# Patient Record
Sex: Female | Born: 1960 | Race: White | Hispanic: No | Smoking: Current every day smoker
Health system: Southern US, Community
[De-identification: ages and names within clinical notes are randomized; demographics above are authoritative.]

## PROBLEM LIST (undated history)

## (undated) DIAGNOSIS — J45909 Unspecified asthma, uncomplicated: Secondary | ICD-10-CM

## (undated) DIAGNOSIS — J449 Chronic obstructive pulmonary disease, unspecified: Secondary | ICD-10-CM

## (undated) DIAGNOSIS — I10 Essential (primary) hypertension: Secondary | ICD-10-CM

## (undated) DIAGNOSIS — G8929 Other chronic pain: Secondary | ICD-10-CM

## (undated) DIAGNOSIS — M2390 Unspecified internal derangement of unspecified knee: Secondary | ICD-10-CM

## (undated) DIAGNOSIS — M5136 Other intervertebral disc degeneration, lumbar region: Secondary | ICD-10-CM

## (undated) DIAGNOSIS — D496 Neoplasm of unspecified behavior of brain: Secondary | ICD-10-CM

## (undated) DIAGNOSIS — C801 Malignant (primary) neoplasm, unspecified: Secondary | ICD-10-CM

## (undated) DIAGNOSIS — M797 Fibromyalgia: Secondary | ICD-10-CM

## (undated) DIAGNOSIS — M169 Osteoarthritis of hip, unspecified: Secondary | ICD-10-CM

## (undated) DIAGNOSIS — M7070 Other bursitis of hip, unspecified hip: Secondary | ICD-10-CM

## (undated) DIAGNOSIS — M47816 Spondylosis without myelopathy or radiculopathy, lumbar region: Secondary | ICD-10-CM

## (undated) DIAGNOSIS — E785 Hyperlipidemia, unspecified: Secondary | ICD-10-CM

## (undated) DIAGNOSIS — I509 Heart failure, unspecified: Secondary | ICD-10-CM

## (undated) DIAGNOSIS — M47817 Spondylosis without myelopathy or radiculopathy, lumbosacral region: Secondary | ICD-10-CM

## (undated) DIAGNOSIS — M1710 Unilateral primary osteoarthritis, unspecified knee: Secondary | ICD-10-CM

## (undated) DIAGNOSIS — G20 Parkinson's disease: Secondary | ICD-10-CM

## (undated) DIAGNOSIS — M719 Bursopathy, unspecified: Secondary | ICD-10-CM

## (undated) DIAGNOSIS — G20A1 Parkinson's disease without dyskinesia, without mention of fluctuations: Secondary | ICD-10-CM

## (undated) DIAGNOSIS — M545 Low back pain: Secondary | ICD-10-CM

## (undated) DIAGNOSIS — M549 Dorsalgia, unspecified: Secondary | ICD-10-CM

## (undated) DIAGNOSIS — I4891 Unspecified atrial fibrillation: Secondary | ICD-10-CM

## (undated) DIAGNOSIS — G4730 Sleep apnea, unspecified: Secondary | ICD-10-CM

## (undated) DIAGNOSIS — K3532 Acute appendicitis with perforation and localized peritonitis, without abscess: Secondary | ICD-10-CM

## (undated) HISTORY — DX: Unspecified internal derangement of unspecified knee: M23.90

## (undated) HISTORY — DX: Essential (primary) hypertension: I10

## (undated) HISTORY — DX: Neoplasm of unspecified behavior of brain: D49.6

## (undated) HISTORY — DX: Fibromyalgia: M79.7

## (undated) HISTORY — DX: Osteoarthritis of hip, unspecified: M16.9

## (undated) HISTORY — DX: Bursopathy, unspecified: M71.9

## (undated) HISTORY — DX: Other intervertebral disc degeneration, lumbar region: M51.36

## (undated) HISTORY — DX: Parkinson's disease without dyskinesia, without mention of fluctuations: G20.A1

## (undated) HISTORY — DX: Unspecified atrial fibrillation: I48.91

## (undated) HISTORY — PX: CARPAL TUNNEL RELEASE: SHX101

## (undated) HISTORY — DX: Sleep apnea, unspecified: G47.30

## (undated) HISTORY — DX: Heart failure, unspecified: I50.9

## (undated) HISTORY — DX: Unspecified asthma, uncomplicated: J45.909

## (undated) HISTORY — DX: Low back pain: M54.5

## (undated) HISTORY — DX: Dorsalgia, unspecified: M54.9

## (undated) HISTORY — DX: Other bursitis of hip, unspecified hip: M70.70

## (undated) HISTORY — DX: Acute appendicitis with perforation, localized peritonitis, and gangrene, without abscess: K35.32

## (undated) HISTORY — DX: Chronic obstructive pulmonary disease, unspecified: J44.9

## (undated) HISTORY — DX: Parkinson's disease: G20

## (undated) HISTORY — DX: Other chronic pain: G89.29

## (undated) HISTORY — PX: APPENDECTOMY: SHX54

## (undated) HISTORY — PX: FINGER SURGERY: SHX640

## (undated) HISTORY — DX: Unilateral primary osteoarthritis, unspecified knee: M17.10

## (undated) HISTORY — DX: Malignant (primary) neoplasm, unspecified: C80.1

## (undated) HISTORY — DX: Spondylosis without myelopathy or radiculopathy, lumbosacral region: M47.817

## (undated) HISTORY — DX: Spondylosis without myelopathy or radiculopathy, lumbar region: M47.816

## (undated) HISTORY — DX: Hyperlipidemia, unspecified: E78.5

---

## 2003-08-15 ENCOUNTER — Other Ambulatory Visit: Payer: Self-pay

## 2005-02-10 ENCOUNTER — Ambulatory Visit: Payer: Self-pay

## 2005-11-02 ENCOUNTER — Ambulatory Visit: Payer: Self-pay | Admitting: Nurse Practitioner

## 2007-08-08 ENCOUNTER — Ambulatory Visit: Payer: Self-pay | Admitting: Family Medicine

## 2009-10-08 ENCOUNTER — Ambulatory Visit: Payer: Self-pay | Admitting: Family Medicine

## 2011-03-03 ENCOUNTER — Ambulatory Visit: Payer: Self-pay | Admitting: Family Medicine

## 2012-04-21 ENCOUNTER — Inpatient Hospital Stay: Payer: Self-pay | Admitting: Surgery

## 2012-04-21 LAB — URINALYSIS, COMPLETE
Bilirubin,UR: NEGATIVE
Hyaline Cast: 3
Leukocyte Esterase: NEGATIVE
Nitrite: NEGATIVE
Protein: 100
Squamous Epithelial: 5
WBC UR: 6 /HPF (ref 0–5)

## 2012-04-21 LAB — COMPREHENSIVE METABOLIC PANEL
Anion Gap: 10 (ref 7–16)
BUN: 14 mg/dL (ref 7–18)
Bilirubin,Total: 1.1 mg/dL — ABNORMAL HIGH (ref 0.2–1.0)
Calcium, Total: 8.5 mg/dL (ref 8.5–10.1)
Co2: 23 mmol/L (ref 21–32)
EGFR (Non-African Amer.): >60
Glucose: 123 mg/dL — ABNORMAL HIGH (ref 65–99)
SGPT (ALT): 16 U/L (ref 12–78)
Sodium: 133 mmol/L — ABNORMAL LOW (ref 136–145)

## 2012-04-21 LAB — CBC WITH DIFFERENTIAL/PLATELET
HCT: 43.2 % (ref 35.0–47.0)
MCH: 28.7 pg (ref 26.0–34.0)
MCHC: 34.1 g/dL (ref 32.0–36.0)
MCV: 84 fL (ref 80–100)
Monocytes: 8 %
RDW: 13.9 % (ref 11.5–14.5)
Segmented Neutrophils: 81 %

## 2012-04-21 LAB — RAPID INFLUENZA A&B ANTIGENS

## 2012-04-21 LAB — LIPASE, BLOOD: Lipase: 72 U/L — ABNORMAL LOW (ref 73–393)

## 2012-04-22 LAB — BASIC METABOLIC PANEL
Anion Gap: 11 (ref 7–16)
BUN: 13 mg/dL (ref 7–18)
Calcium, Total: 7.5 mg/dL — ABNORMAL LOW (ref 8.5–10.1)
Chloride: 103 mmol/L (ref 98–107)
Co2: 22 mmol/L (ref 21–32)
Creatinine: 0.92 mg/dL (ref 0.60–1.30)
Glucose: 145 mg/dL — ABNORMAL HIGH (ref 65–99)
Osmolality: 275 (ref 275–301)
Potassium: 3.6 mmol/L (ref 3.5–5.1)
Sodium: 136 mmol/L (ref 136–145)

## 2012-04-22 LAB — CBC WITH DIFFERENTIAL/PLATELET
HCT: 37 % (ref 35.0–47.0)
Lymphocyte %: 3.4 %
MCH: 29 pg (ref 26.0–34.0)
MCHC: 33.7 g/dL (ref 32.0–36.0)
Monocyte #: 1 x10 3/mm — ABNORMAL HIGH (ref 0.2–0.9)
Monocyte %: 5 %
Platelet: 179 10*3/uL (ref 150–440)
RBC: 4.3 10*6/uL (ref 3.80–5.20)
RDW: 14.2 % (ref 11.5–14.5)
WBC: 19.3 10*3/uL — ABNORMAL HIGH (ref 3.6–11.0)

## 2012-04-23 LAB — CBC WITH DIFFERENTIAL/PLATELET
Basophil #: 0 10*3/uL (ref 0.0–0.1)
Basophil %: 0.2 %
Eosinophil #: 0 10*3/uL (ref 0.0–0.7)
HCT: 32.2 % — ABNORMAL LOW (ref 35.0–47.0)
Lymphocyte #: 0.8 10*3/uL — ABNORMAL LOW (ref 1.0–3.6)
MCH: 28.7 pg (ref 26.0–34.0)
Monocyte #: 1.8 x10 3/mm — ABNORMAL HIGH (ref 0.2–0.9)
Monocyte %: 8 %
Neutrophil #: 19.5 10*3/uL — ABNORMAL HIGH (ref 1.4–6.5)
RBC: 3.73 10*6/uL — ABNORMAL LOW (ref 3.80–5.20)
WBC: 22.1 10*3/uL — ABNORMAL HIGH (ref 3.6–11.0)

## 2012-04-23 LAB — BASIC METABOLIC PANEL
BUN: 29 mg/dL — ABNORMAL HIGH (ref 7–18)
Creatinine: 1.67 mg/dL — ABNORMAL HIGH (ref 0.60–1.30)
Osmolality: 272 (ref 275–301)
Potassium: 3.9 mmol/L (ref 3.5–5.1)

## 2012-04-24 LAB — CBC WITH DIFFERENTIAL/PLATELET
Basophil #: 0 10*3/uL (ref 0.0–0.1)
Eosinophil #: 0 10*3/uL (ref 0.0–0.7)
Eosinophil %: 0.1 %
Lymphocyte #: 1.1 10*3/uL (ref 1.0–3.6)
Lymphocyte %: 5.6 %
MCH: 28.4 pg (ref 26.0–34.0)
MCHC: 32.6 g/dL (ref 32.0–36.0)
MCV: 87 fL (ref 80–100)
Monocyte #: 1.5 x10 3/mm — ABNORMAL HIGH (ref 0.2–0.9)
Neutrophil #: 17.5 10*3/uL — ABNORMAL HIGH (ref 1.4–6.5)
RBC: 4.07 10*6/uL (ref 3.80–5.20)
WBC: 20.1 10*3/uL — ABNORMAL HIGH (ref 3.6–11.0)

## 2012-04-24 LAB — BASIC METABOLIC PANEL
BUN: 24 mg/dL — ABNORMAL HIGH (ref 7–18)
Calcium, Total: 8.2 mg/dL — ABNORMAL LOW (ref 8.5–10.1)
Chloride: 104 mmol/L (ref 98–107)
Co2: 23 mmol/L (ref 21–32)
EGFR (African American): >60
EGFR (Non-African Amer.): 52 — ABNORMAL LOW
Glucose: 132 mg/dL — ABNORMAL HIGH (ref 65–99)
Osmolality: 278 (ref 275–301)
Potassium: 3.9 mmol/L (ref 3.5–5.1)

## 2012-04-24 LAB — TROPONIN I
Troponin-I: 0.06 ng/mL — ABNORMAL HIGH
Troponin-I: 0.03 ng/mL

## 2012-04-24 LAB — CK TOTAL AND CKMB (NOT AT ARMC)
CK-MB: 6.6 ng/mL — ABNORMAL HIGH (ref 0.5–3.6)
CK-MB: 3.3 ng/mL (ref 0.5–3.6)

## 2012-04-25 DIAGNOSIS — I517 Cardiomegaly: Secondary | ICD-10-CM

## 2012-04-25 LAB — CBC WITH DIFFERENTIAL/PLATELET
Basophil #: 0.1 10*3/uL (ref 0.0–0.1)
Basophil %: 0.4 %
Eosinophil %: 0.8 %
HGB: 11.2 g/dL — ABNORMAL LOW (ref 12.0–16.0)
Lymphocyte #: 1.3 10*3/uL (ref 1.0–3.6)
MCH: 28.8 pg (ref 26.0–34.0)
MCHC: 33.7 g/dL (ref 32.0–36.0)
Monocyte #: 1.8 x10 3/mm — ABNORMAL HIGH (ref 0.2–0.9)
Neutrophil #: 15 10*3/uL — ABNORMAL HIGH (ref 1.4–6.5)
Neutrophil %: 81.9 %
Platelet: 286 10*3/uL (ref 150–440)
RBC: 3.88 10*6/uL (ref 3.80–5.20)
RDW: 15.1 % — ABNORMAL HIGH (ref 11.5–14.5)
WBC: 18.3 10*3/uL — ABNORMAL HIGH (ref 3.6–11.0)

## 2012-04-25 LAB — BASIC METABOLIC PANEL
Anion Gap: 6 — ABNORMAL LOW (ref 7–16)
Co2: 28 mmol/L (ref 21–32)
EGFR (Non-African Amer.): 56 — ABNORMAL LOW
Osmolality: 275 (ref 275–301)
Sodium: 135 mmol/L — ABNORMAL LOW (ref 136–145)

## 2012-04-25 LAB — T4, FREE: Free Thyroxine: 1.69 ng/dL — ABNORMAL HIGH (ref 0.76–1.46)

## 2012-04-25 LAB — PATHOLOGY REPORT

## 2012-04-25 LAB — MAGNESIUM: Magnesium: 1.5 mg/dL — ABNORMAL LOW

## 2012-04-25 LAB — CK TOTAL AND CKMB (NOT AT ARMC): CK, Total: 46 U/L (ref 21–215)

## 2012-04-25 LAB — TROPONIN I: Troponin-I: 0.02 ng/mL

## 2012-04-26 DIAGNOSIS — I4892 Unspecified atrial flutter: Secondary | ICD-10-CM

## 2012-04-26 LAB — CBC WITH DIFFERENTIAL/PLATELET
Basophil #: 0.1 10*3/uL (ref 0.0–0.1)
Eosinophil #: 0 10*3/uL (ref 0.0–0.7)
Eosinophil %: 0.3 %
HCT: 37.3 % (ref 35.0–47.0)
HGB: 12.7 g/dL (ref 12.0–16.0)
Lymphocyte %: 12.4 %
MCH: 28.9 pg (ref 26.0–34.0)
MCV: 85 fL (ref 80–100)
Monocyte #: 1.5 x10 3/mm — ABNORMAL HIGH (ref 0.2–0.9)
Monocyte %: 9.8 %
Neutrophil #: 11.4 10*3/uL — ABNORMAL HIGH (ref 1.4–6.5)
Platelet: 355 10*3/uL (ref 150–440)
RDW: 14.8 % — ABNORMAL HIGH (ref 11.5–14.5)

## 2012-04-26 LAB — MAGNESIUM: Magnesium: 2 mg/dL

## 2012-04-27 LAB — CBC WITH DIFFERENTIAL/PLATELET
Basophil #: 0.1 x10 3/mm 3
Basophil %: 0.9 %
Eosinophil #: 0.1 x10 3/mm 3
Eosinophil %: 1 %
HCT: 36.1 %
HGB: 12.2 g/dL
Lymphocyte %: 18.9 %
Lymphs Abs: 2.3 x10 3/mm 3
MCH: 28.8 pg
MCHC: 33.9 g/dL
MCV: 85 fL
Monocyte #: 1.6 "x10 3/mm " — ABNORMAL HIGH
Monocyte %: 12.8 %
Neutrophil #: 8.1 x10 3/mm 3 — ABNORMAL HIGH
Neutrophil %: 66.4 %
Platelet: 386 x10 3/mm 3
RBC: 4.25 X10 6/mm 3
RDW: 14.3 %
WBC: 12.2 x10 3/mm 3 — ABNORMAL HIGH

## 2012-04-27 LAB — BASIC METABOLIC PANEL
BUN: 23 mg/dL — ABNORMAL HIGH (ref 7–18)
Calcium, Total: 7.9 mg/dL — ABNORMAL LOW (ref 8.5–10.1)
Creatinine: 0.9 mg/dL (ref 0.60–1.30)
EGFR (African American): >60
Osmolality: 278 (ref 275–301)
Potassium: 2.9 mmol/L — ABNORMAL LOW (ref 3.5–5.1)

## 2012-04-27 LAB — MAGNESIUM: Magnesium: 2 mg/dL

## 2012-04-28 DIAGNOSIS — I4891 Unspecified atrial fibrillation: Secondary | ICD-10-CM

## 2012-04-28 LAB — BASIC METABOLIC PANEL
Anion Gap: 4 — ABNORMAL LOW (ref 7–16)
Calcium, Total: 8.1 mg/dL — ABNORMAL LOW (ref 8.5–10.1)
Chloride: 94 mmol/L — ABNORMAL LOW (ref 98–107)
Co2: 40 mmol/L (ref 21–32)
EGFR (African American): >60
Glucose: 111 mg/dL — ABNORMAL HIGH (ref 65–99)
Potassium: 3.3 mmol/L — ABNORMAL LOW (ref 3.5–5.1)

## 2012-04-29 DIAGNOSIS — I4891 Unspecified atrial fibrillation: Secondary | ICD-10-CM

## 2012-04-29 LAB — BASIC METABOLIC PANEL
Calcium, Total: 8 mg/dL — ABNORMAL LOW (ref 8.5–10.1)
Chloride: 97 mmol/L — ABNORMAL LOW (ref 98–107)
Co2: 35 mmol/L — ABNORMAL HIGH (ref 21–32)
Creatinine: 0.85 mg/dL (ref 0.60–1.30)
EGFR (African American): >60
Glucose: 105 mg/dL — ABNORMAL HIGH (ref 65–99)
Osmolality: 272 (ref 275–301)
Potassium: 3.1 mmol/L — ABNORMAL LOW (ref 3.5–5.1)

## 2012-04-29 LAB — CBC WITH DIFFERENTIAL/PLATELET
Basophil #: 0.2 10*3/uL — ABNORMAL HIGH (ref 0.0–0.1)
Basophil %: 1.2 %
Eosinophil #: 0.2 10*3/uL (ref 0.0–0.7)
Eosinophil %: 1.4 %
HCT: 36.3 % (ref 35.0–47.0)
HGB: 12.1 g/dL (ref 12.0–16.0)
Lymphocyte #: 2.4 10*3/uL (ref 1.0–3.6)
Lymphocyte %: 18.3 %
MCH: 28.2 pg (ref 26.0–34.0)
MCHC: 33.2 g/dL (ref 32.0–36.0)
Monocyte %: 9.1 %
Neutrophil %: 70 %
RBC: 4.27 10*6/uL (ref 3.80–5.20)
RDW: 14.8 % — ABNORMAL HIGH (ref 11.5–14.5)
WBC: 12.9 10*3/uL — ABNORMAL HIGH (ref 3.6–11.0)

## 2012-04-30 LAB — BASIC METABOLIC PANEL
BUN: 16 mg/dL (ref 7–18)
Calcium, Total: 7.9 mg/dL — ABNORMAL LOW (ref 8.5–10.1)
Chloride: 96 mmol/L — ABNORMAL LOW (ref 98–107)
Creatinine: 0.91 mg/dL (ref 0.60–1.30)
EGFR (Non-African Amer.): >60
Glucose: 113 mg/dL — ABNORMAL HIGH (ref 65–99)
Osmolality: 272 (ref 275–301)
Potassium: 3.4 mmol/L — ABNORMAL LOW (ref 3.5–5.1)
Sodium: 135 mmol/L — ABNORMAL LOW (ref 136–145)

## 2012-04-30 LAB — MAGNESIUM: Magnesium: 1.7 mg/dL — ABNORMAL LOW

## 2012-04-30 LAB — VANCOMYCIN, TROUGH: Vancomycin, Trough: 11 ug/mL (ref 10–20)

## 2012-05-01 LAB — MAGNESIUM: Magnesium: 2 mg/dL

## 2012-05-01 LAB — POTASSIUM: Potassium: 4 mmol/L (ref 3.5–5.1)

## 2012-05-02 ENCOUNTER — Telehealth: Payer: Self-pay

## 2012-05-02 NOTE — Telephone Encounter (Signed)
TCM call Pt's mother called to say someone called her and it was "Efraim Kaufmann" We have no record of this Pt was seen by Dr. Mariah Milling in hosp, was d/c yesterday [id number] fib, acute appendicitis and CHF Confirms compliance with meds as prescribed at d/c Denies worsening symptoms Has appt with [facility] This week, as well as endocrinology and pulm Made appt for hosp f/u with Dr. Mariah Milling for 2/17 Understanding verb

## 2012-05-08 ENCOUNTER — Encounter: Payer: Self-pay | Admitting: Cardiovascular Disease

## 2012-05-08 ENCOUNTER — Ambulatory Visit (INDEPENDENT_AMBULATORY_CARE_PROVIDER_SITE_OTHER): Payer: 59 | Admitting: Cardiovascular Disease

## 2012-05-08 VITALS — BP 124/86 | HR 67 | Ht 64.0 in | Wt 212.8 lb

## 2012-05-08 DIAGNOSIS — R109 Unspecified abdominal pain: Secondary | ICD-10-CM

## 2012-05-08 DIAGNOSIS — I10 Essential (primary) hypertension: Secondary | ICD-10-CM

## 2012-05-08 DIAGNOSIS — I4892 Unspecified atrial flutter: Secondary | ICD-10-CM | POA: Insufficient documentation

## 2012-05-08 DIAGNOSIS — R0789 Other chest pain: Secondary | ICD-10-CM

## 2012-05-08 DIAGNOSIS — R0602 Shortness of breath: Secondary | ICD-10-CM | POA: Insufficient documentation

## 2012-05-08 NOTE — Assessment & Plan Note (Signed)
Mild residual discomfort following her ruptured appendix with abscess. Symptoms are mild.

## 2012-05-08 NOTE — Assessment & Plan Note (Signed)
As of breath likely secondary to being deconditioned following her hospital course. Also long history of smoking, possible COPD. No signs of heart failure.

## 2012-05-08 NOTE — Progress Notes (Signed)
Patient ID: Molly Rogers, female    DOB: Sep 07, 1960, 52 y.o.   MRN: [id number]  HPI Comments: 52 year old woman with long history of smoking, COPD, who presented to the hospital 04/21/2012 with ruptured appendix, also history of meningioma, chronic neurologic issues taken to the operating room every first 2014 for laparoscopic appendectomy found to have a large abscess in the pelvis with a drain placed, started on antibiotics, converting to atrial flutter during her hospital course. Cardiology was consult in for arrhythmia.  Heart rate increased to 150 beats per minute. Rhythm was narrow complex, regular. Adenosine was given that showed flutter waves. There has been no improvement on diltiazem bolus and infusion and she was changed to amiodarone bolus and infusion. Later that evening, she converted to normal sinus rhythm. Amiodarone infusion was continued until she was tolerating by mouth medications. She presents today on amiodarone 200 mg twice a day.  Since her discharge, she reports that she feels tired, no significant palpitations, tachycardia. Otherwise she feels well. She has stopped smoking since discharge. Abdomen is still slightly tender. She has lost weight through her hospital course and does not on blood pressure medication as she was prior to admission.  EKG shows normal sinus rhythm with rate 67 beats per minute with no significant ST or T wave changes  Echocardiogram in the hospital every fourth 2014 showed normal systolic function ejection fraction 50-55%, otherwise normal study   Outpatient Encounter Prescriptions as of 05/08/2012  Medication Sig Dispense Refill  . albuterol (PROVENTIL) (2.5 MG/3ML) 0.083% nebulizer solution Take 2.5 mg by nebulization every 6 (six) hours as needed for wheezing.      Marland Kitchen ALPRAZolam (XANAX) 0.25 MG tablet Take 0.25 mg by mouth 3 (three) times daily as needed for sleep.      Marland Kitchen amiodarone (PACERONE) 200 MG tablet Take 200 mg by mouth 2 (two)  times daily.      Marland Kitchen amoxicillin (AMOXIL) 875 MG tablet Take 875 mg by mouth 2 (two) times daily.      Marland Kitchen buPROPion (WELLBUTRIN XL) 150 MG 24 hr tablet Take 150 mg by mouth 2 (two) times daily.      . citalopram (CELEXA) 40 MG tablet Take 40 mg by mouth daily.      . Fluticasone-Salmeterol (ADVAIR) 100-50 MCG/DOSE AEPB Inhale 1 puff into the lungs every 12 (twelve) hours.      Marland Kitchen ibuprofen (ADVIL,MOTRIN) 800 MG tablet Take 800 mg by mouth every 8 (eight) hours as needed for pain.      Marland Kitchen levofloxacin (LEVAQUIN) 500 MG tablet Take 500 mg by mouth daily.      . ondansetron (ZOFRAN) 4 MG tablet Take 4 mg by mouth every 6 (six) hours as needed for nausea.      Marland Kitchen oxyCODONE-acetaminophen (PERCOCET/ROXICET) 5-325 MG per tablet Take 1 tablet by mouth every 4 (four) hours as needed for pain.       No facility-administered encounter medications on file as of 05/08/2012.     Review of Systems  Constitutional: Positive for fatigue and unexpected weight change.  HENT: Negative.   Eyes: Negative.   Respiratory: Negative.   Cardiovascular: Negative.   Gastrointestinal: Positive for abdominal pain.  Musculoskeletal: Negative.   Skin: Negative.   Neurological: Positive for weakness.  Psychiatric/Behavioral: Negative.   All other systems reviewed and are negative.   BP 124/86  Pulse 67  Ht 5\' 4"  (1.626 m)  Wt 212 lb 12 oz (96.503 kg)  BMI 36.5 kg/m2  Physical  Exam  Nursing note and vitals reviewed. Constitutional: She is oriented to person, place, and time. She appears well-developed and well-nourished.  HENT:  Head: Normocephalic.  Nose: Nose normal.  Mouth/Throat: Oropharynx is clear and moist.  Eyes: Conjunctivae are normal. Pupils are equal, round, and reactive to light.  Neck: Normal range of motion. Neck supple. No JVD present.  Cardiovascular: Normal rate, regular rhythm, S1 normal, S2 normal, normal heart sounds and intact distal pulses.  Exam reveals no gallop and no friction rub.   No  murmur heard. Pulmonary/Chest: Effort normal and breath sounds normal. No respiratory distress. She has no wheezes. She has no rales. She exhibits no tenderness.  Abdominal: Soft. Bowel sounds are normal. She exhibits no distension. There is no tenderness.  Musculoskeletal: Normal range of motion. She exhibits no edema and no tenderness.  Lymphadenopathy:    She has no cervical adenopathy.  Neurological: She is alert and oriented to person, place, and time. Coordination normal.  Skin: Skin is warm and dry. No rash noted. No erythema.  Psychiatric: She has a normal mood and affect. Her behavior is normal. Judgment and thought content normal.    Assessment and Plan

## 2012-05-08 NOTE — Assessment & Plan Note (Signed)
Blood pressure improved after recent weight loss from her hospital admission. We'll not restart any medications at this time

## 2012-05-08 NOTE — Patient Instructions (Addendum)
You are doing well. Please cut the amiodarone to one a day for the next two weeks than stop the amiodarone  Monitor your blood pressure at home with the nurse Call the office for top number >140, bottom number >90  Please call us if you have new issues that need to be addressed before your next appt.

## 2012-05-08 NOTE — Assessment & Plan Note (Addendum)
Maintaining normal sinus rhythm. Arrhythmia in the past likely secondary to sepsis from ruptured appendix. We have suggested she decrease amiodarone to 200 mg daily for the next 2 weeks and then stop amiodarone. We have suggested she call he office for any palpitations.

## 2012-05-12 ENCOUNTER — Telehealth: Payer: Self-pay | Admitting: *Deleted

## 2012-05-12 NOTE — Telephone Encounter (Signed)
Pt POA , Lorelee Cover, called and states pt just returned from Dr Michela Pitcher office. Dr Marlowe Kays nurse informed pt that in the notes it states pt has CHF. Pt was not aware of this. Pt And POA is concerned. Dois Davenport states that Dr Mariah Milling did not mention this when pt was in for appointment.

## 2012-05-15 NOTE — Telephone Encounter (Signed)
I reassured POA, per Dr. Windell Hummingbird note from last OV, pt does not have CHF; may have had this dx in hospital, but as of last Office note, no mention of this dx Understanding verb

## 2012-05-15 NOTE — Telephone Encounter (Signed)
LMTCB

## 2012-05-15 NOTE — Telephone Encounter (Signed)
Please review patient chart

## 2012-05-24 ENCOUNTER — Ambulatory Visit: Payer: Self-pay | Admitting: Surgery

## 2012-05-24 LAB — CBC WITH DIFFERENTIAL/PLATELET
Basophil %: 0.8 %
Eosinophil #: 0.2 10*3/uL (ref 0.0–0.7)
HCT: 40.8 % (ref 35.0–47.0)
HGB: 13.9 g/dL (ref 12.0–16.0)
Lymphocyte %: 40.4 %
MCH: 29 pg (ref 26.0–34.0)
MCHC: 33.9 g/dL (ref 32.0–36.0)
Monocyte #: 0.7 x10 3/mm (ref 0.2–0.9)
Neutrophil #: 4 10*3/uL (ref 1.4–6.5)
Neutrophil %: 48.9 %
Platelet: 219 10*3/uL (ref 150–440)
WBC: 8.2 10*3/uL (ref 3.6–11.0)

## 2012-05-24 LAB — HEPATIC FUNCTION PANEL A (ARMC)
Bilirubin, Direct: 0.2 mg/dL (ref 0.00–0.20)
SGOT(AST): 19 U/L (ref 15–37)
Total Protein: 6.9 g/dL (ref 6.4–8.2)

## 2012-05-24 LAB — BASIC METABOLIC PANEL
BUN: 10 mg/dL (ref 7–18)
Calcium, Total: 9 mg/dL (ref 8.5–10.1)
Chloride: 105 mmol/L (ref 98–107)
Co2: 27 mmol/L (ref 21–32)
Glucose: 93 mg/dL (ref 65–99)
Potassium: 3.8 mmol/L (ref 3.5–5.1)
Sodium: 141 mmol/L (ref 136–145)

## 2012-05-24 LAB — TSH: Thyroid Stimulating Horm: 2.54 u[IU]/mL

## 2012-05-25 ENCOUNTER — Telehealth: Payer: Self-pay

## 2012-05-25 NOTE — Telephone Encounter (Signed)
Patient having increased blood pressure of R arm 158/100 and  L arm 170/100 HR checked by Brandy with [facility]. Patient had an emergency surgery for ruptured appendix last week. She is having abdominal pain in the pelvic region with a 5-8 lb weight gain this week.  Since the step down of amiodarone to 200 mg one tablet daily has not felt very good. Please contact ASAP with instructions for what to do regarding the increased blood pressure. The patient is going for a CAT scan in the am per Dr. Michela Pitcher.

## 2012-05-25 NOTE — Telephone Encounter (Signed)
Pt's caregiver states pt c/o high bp=158/100, 170/100, 140/100 C/o increased abdominal pain, diarrhea, pelvic pain and burning in pelvic region Unsure if she is running fever Pt cancelled PT today d/t feeling so poorly Caregiver says this is not like her and is beginning to feel as she did prior to emergency appendectomy Says they called Dr. Marlowe Kays office and spoke with their nurse Was told she would have CT abd. tomm am and to call us re: high BPs Pt was taking lisinopril 20 mg qd prior to admission and was d/c home on amiodarone and was told to d/c lisinopril Says weight is up 5-8 pounds this week and pt states, she is "swollen" Not on a diuretic This was d/c'd at discharge I discussed this case with Dr. Elease Hashimoto who is in office. He says ok for pt to take lisinopril 20 mg tonight to see if this helps BP, otherwise high BP may be r/t infection and should be seen in ER Caregiver informed Understanding verb

## 2012-05-29 ENCOUNTER — Ambulatory Visit: Payer: Self-pay | Admitting: Surgery

## 2012-05-30 ENCOUNTER — Telehealth: Payer: Self-pay | Admitting: *Deleted

## 2012-05-30 NOTE — Telephone Encounter (Signed)
Pt called stating that she had a CT scan done yesterday and was told to call the office when she had it done.

## 2012-05-31 NOTE — Telephone Encounter (Signed)
Will review with Dr. Mariah Milling

## 2012-05-31 NOTE — Telephone Encounter (Signed)
Pts caregiver says pt had CT scan of abdomen as scheduled by Dr. Michela Pitcher and was told that Dr. Michela Pitcher and Dr. Mariah Milling would review CT scan and discuss situation and call her back She wanted [place] to know pt had CT yesterday and is awaiting results pts BP is better since taking lisinopril and is feeling "some better" Says she is to stop amiodarone today (per Dr. Windell Hummingbird instruction last OV) She asks if they should continue the lisinopril, etc I told her I would check with Dr. Mariah Milling and call her back Understanding verb

## 2012-06-05 NOTE — Telephone Encounter (Signed)
Please advise 

## 2012-06-05 NOTE — Telephone Encounter (Signed)
Could stop amio Don't see lisinopril on the list of meds from office visit Could hold and monitor BP CT follow up with dr. Michela Pitcher

## 2012-06-06 NOTE — Telephone Encounter (Signed)
Care giver informed Has appt with Dr. Michela Pitcher next week to f/u to CT scan Home health nurse coming to home tomm  They will fax BP readings to [place]

## 2012-06-07 ENCOUNTER — Telehealth: Payer: Self-pay

## 2012-06-07 NOTE — Telephone Encounter (Signed)
Molly Rogers with [facility] called and wanted to let you know pt BP -122/86; HR - 76 for today  06/02/12 BP - 134/84; HR- 90 States you do not need to call her back

## 2012-06-09 NOTE — Telephone Encounter (Signed)
Molly Rogers called and wanted to see if [facility] nurse called with pt readings. I informed her they did call, and she would still like a nurse to call her.

## 2012-06-12 NOTE — Telephone Encounter (Signed)
Caregiver asks if pt should remain on lisinopril as prescribed, based on BP results I advised continue as prescribed Understanding verb

## 2012-06-20 ENCOUNTER — Ambulatory Visit: Payer: Self-pay | Admitting: Surgery

## 2012-06-20 LAB — HEPATIC FUNCTION PANEL A (ARMC)
Albumin: 3.6 g/dL (ref 3.4–5.0)
Bilirubin, Direct: <0.1 mg/dL (ref 0.00–0.20)
SGOT(AST): 21 U/L (ref 15–37)

## 2012-06-21 ENCOUNTER — Ambulatory Visit: Payer: Self-pay

## 2012-07-25 ENCOUNTER — Ambulatory Visit: Payer: Self-pay | Admitting: Specialist

## 2012-07-27 ENCOUNTER — Ambulatory Visit: Payer: Self-pay

## 2012-08-16 ENCOUNTER — Ambulatory Visit: Payer: Self-pay | Admitting: Specialist

## 2012-08-29 ENCOUNTER — Ambulatory Visit: Payer: Self-pay

## 2012-10-18 ENCOUNTER — Emergency Department: Payer: Self-pay | Admitting: Emergency Medicine

## 2012-11-09 ENCOUNTER — Ambulatory Visit: Payer: Self-pay | Admitting: Orthopedic Surgery

## 2012-11-29 DIAGNOSIS — F419 Anxiety disorder, unspecified: Secondary | ICD-10-CM | POA: Insufficient documentation

## 2012-11-29 DIAGNOSIS — I509 Heart failure, unspecified: Secondary | ICD-10-CM | POA: Insufficient documentation

## 2012-11-29 DIAGNOSIS — G250 Essential tremor: Secondary | ICD-10-CM | POA: Insufficient documentation

## 2012-12-13 ENCOUNTER — Encounter: Payer: Self-pay | Admitting: Psychiatry

## 2012-12-14 ENCOUNTER — Other Ambulatory Visit: Payer: Self-pay | Admitting: Rheumatology

## 2012-12-14 LAB — SYNOVIAL CELL COUNT + DIFF, W/ CRYSTALS
Eosinophil: 0 %
Lymphocytes: 39 %

## 2012-12-20 ENCOUNTER — Encounter: Payer: Self-pay | Admitting: Psychiatry

## 2013-04-06 ENCOUNTER — Ambulatory Visit: Payer: Self-pay | Admitting: Podiatry

## 2013-04-20 ENCOUNTER — Ambulatory Visit: Payer: Self-pay | Admitting: Podiatry

## 2013-04-24 ENCOUNTER — Ambulatory Visit: Payer: Medicaid Other

## 2013-04-24 ENCOUNTER — Ambulatory Visit (INDEPENDENT_AMBULATORY_CARE_PROVIDER_SITE_OTHER): Payer: Medicaid Other | Admitting: Podiatry

## 2013-04-24 ENCOUNTER — Ambulatory Visit (INDEPENDENT_AMBULATORY_CARE_PROVIDER_SITE_OTHER): Payer: Medicaid Other

## 2013-04-24 ENCOUNTER — Encounter: Payer: Self-pay | Admitting: Podiatry

## 2013-04-24 VITALS — BP 154/69 | HR 72 | Resp 12 | Ht 62.0 in | Wt 205.0 lb

## 2013-04-24 DIAGNOSIS — R52 Pain, unspecified: Secondary | ICD-10-CM

## 2013-04-24 DIAGNOSIS — M722 Plantar fascial fibromatosis: Secondary | ICD-10-CM

## 2013-04-24 MED ORDER — TRIAMCINOLONE ACETONIDE 10 MG/ML IJ SUSP
10.0000 mg | Freq: Once | INTRAMUSCULAR | Status: AC
  Administered 2013-04-24: 10 mg

## 2013-04-24 NOTE — Progress Notes (Signed)
N-SORE L-B/L FOOT D- 6 MONTHS O-SLOWLY C-WORSE A-PRESSURE T-N/;A

## 2013-04-24 NOTE — Progress Notes (Signed)
   Subjective:    Patient ID: Molly Rogers, female    DOB: 1960-10-09, 53 y.o.   MRN: [id number]  HPI    Review of Systems  Constitutional: Positive for chills, appetite change, fatigue and unexpected weight change.  HENT: Positive for sinus pressure.   Eyes: Positive for visual disturbance.  Respiratory: Positive for chest tightness, shortness of breath and wheezing.   Cardiovascular: Positive for leg swelling.  Gastrointestinal: Positive for nausea.  Endocrine: Positive for polyuria.  Musculoskeletal: Positive for back pain, gait problem, joint swelling and myalgias.  Neurological: Positive for dizziness, tremors, weakness, light-headedness and headaches.  Psychiatric/Behavioral: The patient is nervous/anxious.   All other systems reviewed and are negative.       Objective:   Physical Exam        Assessment & Plan:

## 2013-04-24 NOTE — Progress Notes (Signed)
Subjective:     Patient ID: Molly Rogers, female   DOB: 1960-10-05, 53 y.o.   MRN: [id number]  HPI patient states that I am having a lot of pain in my heels and this is been present for about 6 months. Does not remember any specific incident that caused this but it has gradually gotten worse over that time   Review of Systems  All other systems reviewed and are negative.       Objective:   Physical Exam  Nursing note and vitals reviewed. Constitutional: She is oriented to person, place, and time.  Cardiovascular: Intact distal pulses.   Musculoskeletal: Normal range of motion.  Neurological: She is oriented to person, place, and time.  Skin: Skin is warm.   neurovascular status intact with pain in the plantar heels bilateral with inflammation and fluid around the medial band. Patient's muscle strength was adequate and no equinus condition was noted     Assessment:     Plantar fasciitis of both heels with inflammation present    Plan:     H&P and x-rays reviewed. Injected the plantar fascia both feet 3 mg Kenalog 5 mg Xylocaine Marcaine mixture and instructed on physical therapy with instructions given to patient at this time. Reappoint in 2 week

## 2013-04-24 NOTE — Patient Instructions (Signed)

## 2013-05-08 ENCOUNTER — Ambulatory Visit: Payer: Medicaid Other | Admitting: Podiatry

## 2013-05-18 ENCOUNTER — Ambulatory Visit: Payer: Self-pay | Admitting: Podiatry

## 2013-05-22 ENCOUNTER — Ambulatory Visit (INDEPENDENT_AMBULATORY_CARE_PROVIDER_SITE_OTHER): Payer: Medicaid Other | Admitting: Podiatry

## 2013-05-22 VITALS — BP 107/75 | HR 75 | Resp 16 | Ht 62.0 in | Wt 210.0 lb

## 2013-05-22 DIAGNOSIS — M722 Plantar fascial fibromatosis: Secondary | ICD-10-CM

## 2013-05-22 MED ORDER — TRIAMCINOLONE ACETONIDE 10 MG/ML IJ SUSP
10.0000 mg | Freq: Once | INTRAMUSCULAR | Status: AC
  Administered 2013-05-22: 10 mg

## 2013-05-22 NOTE — Progress Notes (Signed)
Subjective:     Patient ID: Molly Rogers, female   DOB: 16-Jul-1960, 53 y.o.   MRN: [id number]  HPI patient continues to experience heel pain in both feet with some improvement from previous treatment and continued stretching   Review of Systems     Objective:   Physical Exam Neurovascular status intact with patient well oriented x3 and continued discomfort in the plantar heel right at the insertional point of the tendon into the calcaneus    Assessment:     Plantar fasciitis heel region both feet still painful but improved    Plan:     Instructed on regimen of physical therapy and reinjected the plantar fascia 3 mg Kenalog 5 mg Xylocaine Marcaine mixture and instructed on supportive shoes

## 2013-05-25 ENCOUNTER — Telehealth: Payer: Self-pay | Admitting: *Deleted

## 2013-05-25 NOTE — Telephone Encounter (Signed)
PT CALLED WANTING TO LET us KNOW THAT THE INJECTIONS MADE HER FEET WORSE. DID NOT WANT TO MAKE AN APPT TO COME IN AND SAID SHE DOES NOT HAVE THE MONEY TO HAVE THE ORTHOTICS DONE AT THIS TIME. TOLD PT SHE CAN TRY ELEVATING, STAY OFF OF FEET AND ICE. PT UNDERSTOOD.

## 2013-06-13 ENCOUNTER — Ambulatory Visit: Payer: Self-pay | Admitting: Physical Medicine and Rehabilitation

## 2013-06-13 ENCOUNTER — Ambulatory Visit: Payer: Self-pay | Admitting: Neurology

## 2013-07-10 ENCOUNTER — Ambulatory Visit: Payer: Self-pay

## 2013-07-13 ENCOUNTER — Encounter: Payer: Self-pay | Admitting: Family Medicine

## 2013-07-30 ENCOUNTER — Ambulatory Visit: Payer: Self-pay | Admitting: Family Medicine

## 2013-08-15 ENCOUNTER — Encounter: Payer: Self-pay | Admitting: Family Medicine

## 2013-08-16 DIAGNOSIS — M179 Osteoarthritis of knee, unspecified: Secondary | ICD-10-CM

## 2013-08-16 DIAGNOSIS — M1712 Unilateral primary osteoarthritis, left knee: Secondary | ICD-10-CM | POA: Insufficient documentation

## 2013-08-16 DIAGNOSIS — M1710 Unilateral primary osteoarthritis, unspecified knee: Secondary | ICD-10-CM

## 2013-08-16 HISTORY — DX: Osteoarthritis of knee, unspecified: M17.9

## 2013-08-16 HISTORY — DX: Unilateral primary osteoarthritis, unspecified knee: M17.10

## 2013-08-17 DIAGNOSIS — I340 Nonrheumatic mitral (valve) insufficiency: Secondary | ICD-10-CM | POA: Insufficient documentation

## 2013-08-17 DIAGNOSIS — I4891 Unspecified atrial fibrillation: Secondary | ICD-10-CM | POA: Insufficient documentation

## 2013-08-20 ENCOUNTER — Encounter: Payer: Self-pay | Admitting: Family Medicine

## 2013-08-20 DIAGNOSIS — M47816 Spondylosis without myelopathy or radiculopathy, lumbar region: Secondary | ICD-10-CM

## 2013-08-20 DIAGNOSIS — M5116 Intervertebral disc disorders with radiculopathy, lumbar region: Secondary | ICD-10-CM | POA: Insufficient documentation

## 2013-08-20 DIAGNOSIS — M7061 Trochanteric bursitis, right hip: Secondary | ICD-10-CM | POA: Insufficient documentation

## 2013-08-20 DIAGNOSIS — M7060 Trochanteric bursitis, unspecified hip: Secondary | ICD-10-CM | POA: Insufficient documentation

## 2013-08-20 HISTORY — DX: Spondylosis without myelopathy or radiculopathy, lumbar region: M47.816

## 2013-09-03 ENCOUNTER — Ambulatory Visit: Payer: Self-pay | Admitting: Surgery

## 2013-09-18 ENCOUNTER — Ambulatory Visit: Payer: Self-pay | Admitting: Internal Medicine

## 2013-09-28 DIAGNOSIS — M2390 Unspecified internal derangement of unspecified knee: Secondary | ICD-10-CM

## 2013-09-28 HISTORY — DX: Unspecified internal derangement of unspecified knee: M23.90

## 2013-11-16 ENCOUNTER — Ambulatory Visit: Payer: Self-pay | Admitting: General Practice

## 2013-11-22 ENCOUNTER — Ambulatory Visit: Payer: Self-pay

## 2014-01-04 ENCOUNTER — Ambulatory Visit: Payer: Self-pay | Admitting: Family Medicine

## 2014-07-01 DIAGNOSIS — D497 Neoplasm of unspecified behavior of endocrine glands and other parts of nervous system: Secondary | ICD-10-CM | POA: Insufficient documentation

## 2014-07-01 DIAGNOSIS — D329 Benign neoplasm of meninges, unspecified: Secondary | ICD-10-CM | POA: Insufficient documentation

## 2014-07-12 NOTE — H&P (Signed)
PATIENT NAME:  Molly Rogers, TOWERS MR#:  [id number] DATE OF BIRTH:  12/18/1960  DATE OF ADMISSION:  04/21/2012  PRIMARY CARE PHYSICIAN: [facility].   ADMITTING PHYSICIAN: Dr. Pat Patrick.   CHIEF COMPLAINT: Abdominal pain, nausea and vomiting.   BRIEF HISTORY: The patient is a 54 year old woman seen in the Emergency Room with a confusing history. She has had a several day history of abdominal pain, projectile vomiting, nausea, diarrhea and abdominal distention. She presented to the Emergency Room for further evaluation. She has a history of meningioma, being evaluated at [facility], and has not had surgery for this problem as yet. She complains of some headache, initially underwent CT scan of the head. No significant abnormality other than sinusitis was identified. Laboratory values revealed white blood cell count of 30,000, so abdominal CT was performed. CT scan demonstrated right lower quadrant abscess consistent with possible ruptured appendix. She had a significantly dilated appendix, periappendiceal inflammatory changes and what looked like a contained perforation. The surgical service was consulted.   She denies any previous similar symptoms. She denies history of hepatitis, yellow jaundice, pancreatitis, peptic ulcer disease, gallbladder disease or diverticulitis.   PAST MEDICAL AND SURGICAL HISTORY:  Only previous surgery in the abdomen has been a laparoscopic tubal ligation. She has had carpal tunnel surgery and broken finger surgery. She has had recent workup for her neurologic problems felt to be related to her meningioma. She has essential tremor and speech difficulties, all of which are being evaluated by the neurology service at [facility], likely unrelated to the current problem.   She has a history of COPD, asthma, reactive airway disease, hypertension. There is no history of diabetes or cardiac disease.   CURRENT MEDICATIONS: Include Advair 100 mcg/50 mcg twice a day, Celexa 40 mg once  a day, hydrochlorothiazide 25 mg once a day, lisinopril 20 mg once a day, loperamide 4 mg q.4-6 hours p.r.n., Motrin 800 mg p.o. q.8 hours, Nifedical XL 30 mg p.o. once a day, ProAir HFA 2 puffs every 6 hours p.r.n., Wellbutrin SR 150 mg b.i.d., Xanax 0.25 mg p.o. t.i.d. and Zofran 8 mg disintegrating tablet p.r.n.   ALLERGIES: SHE IS ALLERGIC TO NAPROXEN.   SOCIAL HISTORY: She is a cigarette smoker. She does not drink significant amounts of alcohol.   REVIEW OF SYSTEMS: Otherwise unremarkable with exception of the symptoms noted above.   PHYSICAL EXAMINATION:   VITAL SIGNS: At time of admission demonstrate blood pressure of 105/60, heart rate of 90 and regular, oxygen saturation stable.  HEENT: No scleral icterus. No facial deformities. No pupillary abnormalities.  NECK: Supple, nontender with no organomegaly and midline trachea.  CHEST: Clear with no adventitious sounds. She has normal pulmonary excursion, limited only by her abdominal pain.  CARDIAC: No murmurs or gallops. She seems to be in normal sinus rhythm.  ABDOMEN: Soft but distended, with marked right lower quadrant pain referred to the left lower quadrant. She has guarding and rebound. She has hypoactive tinkling bowel sounds.  EXTREMITIES: Lower extremity exam reveals some moderate decrease in full range of motion. She does have good distal pulses. No deformities are noted.  PSYCHIATRIC: Normal orientation, normal affect.   IMPRESSION AND PLAN: I have independently reviewed her CT scan. CT scan does demonstrate what appears to be a pelvic abscess with distended appendix, periappendiceal inflammatory changes and contained perforation. There is no other free air in the abdomen. This presentation would suggest perforated viscus, possible perforated appendicitis. With her abdominal symptoms and her clinical  presentation, e would recommend urgent surgical intervention. This plan has been discussed with the patient in detail and she is in  agreement.    ____________________________ Micheline Maze, MD rle:jm D: 04/21/2012 20:33:21 ET T: 04/21/2012 21:39:06 ET JOB#: [id number]  cc: Micheline Maze, MD, <Dictator> [place] MD ELECTRONICALLY SIGNED 04/22/2012 22:13

## 2014-07-12 NOTE — Consult Note (Signed)
Chief Complaint and History:   Referring Physician Dr. Bridgette Habermann    Chief Complaint abnormal thyroid function tests   Allergies:  Naproxen: Unknown  Assessment/Plan:   Assessment/Plan 54 yo F admitted on 04/21/13 with abd pain/n/v found to have RLQ abscress due to ruptured appendicitis, post op with tachycardia and rapid a.fib/flutter. Thyrois labs abnml with low TSH of 0.134 and elevated free T4 of 1.69. No h/o thyroid diz. Pt was not interviewed as she is sedated. Family at bedside was interviewed. Pt was examined and no goiter was evident, although exam was challenging. Her chart was reviewed.  A/ 1. Abnormal thyroid function test consistent with mild hyperthyroidism 2. Tachycardia / rapid a.fib 3. RLQ abscess s/p appendectomy  P/ Hyperthyroidism is fairly mild and felt to not likely contributing to tachycardia. Unknown if hyperthyroidism is acute versus chronic. Acute hyperthyroidism due to thyroiditis in setting of acute illness is most likely. This is typically a self-resolving condition. She has some reported symptoms of chronic hyperthyroidism including 6 months of tremor, heat intolerance, and anxiety. I will request old labs from [facility]. I will obtain a total T3 level and thyroid auto-antibodies. I will recheck a TSH tomorrow. I am going to defer treatment for now. I will follow with you.   Electronic Signatures: Judi Cong (MD)  (Signed 04-Feb-14 16:27)  Authored: Chief Complaint and History, ALLERGIES, Assessment/Plan   Last Updated: 04-Feb-14 16:27 by Judi Cong (MD)

## 2014-07-12 NOTE — Op Note (Signed)
PATIENT NAME:  Molly Rogers, Molly Rogers MR#:  [id number] DATE OF BIRTH:  04/04/1960  DATE OF PROCEDURE:  04/22/2012  PREOPERATIVE DIAGNOSIS: Ruptured acute appendicitis.   POSTOPERATIVE DIAGNOSIS: Ruptured acute appendicitis.   OPERATION: Appendectomy.   SURGEON: Rodena Goldmann, III, M.D.   ANESTHESIA: General.   OPERATIVE PROCEDURE: With the patient in the supine position, after the induction of appropriate general anesthesia, the abdomen was prepped with ChloraPrep and draped with sterile towels. The patient was placed in the head down, feet up position. A small infraumbilical incision was made in standard fashion, carried down bluntly through the subcutaneous tissue. The Veress needle was used to cannulate the peritoneal cavity. CO2 was insufflated to appropriate pressure measurements; 0.5 liters of CO2 were instilled, the Veress needle was withdrawn. An 11 mm [employer] port was inserted into the peritoneal cavity. The position was confirmed, the CO2 was reinsufflated. The right lower quadrant was investigated. There did appear to be inflammatory changes and the omentum was stuck over the right lower quadrant structures. A right upper quadrant transverse incision made and an 11 mm port was inserted under direct vision. A left lower quadrant transverse incision was made and a 12 mm port inserted under direct vision. The right lower quadrant was again investigated with the suction and a Babcock. There was a large purulent mass, it appeared to be an inflamed ruptured appendicitis. The rupture appeared to be at the base of the appendix. Dissection was quite tedious in this area and a fourth port was placed in the left mid abdomen, and an 11 mm port was inserted to help with retraction and better visualization. I considered opening, but I was concerned about the possibility of poor visualization with all inflammatory change and the woman was moderately obese. The appendiceal rupture appear to be right at  the base of the appendix. The mesoappendix was taken out with 2 application of the Endo GIA carrying a white load, and the appendix amputated right at the rupture using an Endo GIA stapler carrying a blue load. I then elected to oversew the cecum and the surrounding mesoappendix and fat off the terminal ileum using the EndoStitch apparatus and the Surgidac suture. Three sutures were placed covering the base of the appendix. Because of the inflammatory reaction and the localized area, a Blake drain was inserted through the left lower quadrant stab wound and brought out through the right upper quadrant incision laying in the base of the right gutter along into the right pelvis. All ports were withdrawn without difficulty. The abdomen was desufflated. The skin incisions were closed with 5-0 nylon. The drain was secured with 3-0 nylon. The area was infiltrated with 0.25% Marcaine for postoperative pain control. Sterile dressings were applied. The patient was returned to the Recovery Room having tolerated the procedure well. Sponge, instrument, and needle counts were correct x 2 in the Operating Room.  ____________________________ Rodena Goldmann III, MD rle:jm D: 04/22/2012 02:15:22 ET T: 04/22/2012 11:44:37 ET JOB#: [id number]  cc: Rodena Goldmann III, MD, <Dictator> Rodena Goldmann MD ELECTRONICALLY SIGNED 04/22/2012 22:13

## 2014-07-12 NOTE — Consult Note (Signed)
PATIENT NAME:  Molly Rogers, Molly Rogers MR#:  [id number] DATE OF BIRTH:  07-01-1960  DATE OF CONSULTATION:  04/25/2012  REFERRING PHYSICIAN:  Vivien Presto, MD  CONSULTING PHYSICIAN:  A. Lavone Orn, MD  CHIEF COMPLAINT:  Possible hyperthyroidism.   HISTORY OF PRESENT ILLNESS:  This is a 54 year old female seen in consultation for concerns of possible hyperthyroidism. The patient's chart was reviewed. She was admitted on April 21, 2012 with several days of vomiting and 1 day of abdominal pain. She was found to have significant leukocytosis. A CT scan showed a right lower quadrant abscess. There was concern for a ruptured appendix. She underwent an appendectomy on April 22, 2012. Postoperatively, she developed mild hypoxia and tachycardia. Her white blood cell count initially declined. On her second hospital day, the tachycardia was fairly pronounced. She developed atrial fibrillation with a rate above 150. Shortness of breath worsened. She was then initiated on diltiazem, followed by amiodarone bolus and infusion and she underwent a chest CT to rule out a PE which was negative. A lower extremity Doppler was also negative for DVT. Labs drawn today included a TSH that was found to be mildly low at 0.134 uIU/mL (reference range 0.45 to 4.5) as well as a mildly elevated free T4 of 1.69 ng/dL (reference range 0.76 to 1.46). She remains tachycardic and in the CCU. Digoxin was added today. The patient was unable to be interviewed as she was fairly sedated. The family including her parents and aunt were interviewed at the bedside. She has no known prior history of thyroid disease. Her father and aunt both have hypothyroidism and take thyroid hormone replacement. According to family, she has had a tremor which began in June 2013. It had gradually worsened with time. She also has been reporting heat intolerance and has been using fans to keep cool. They do not believe she has reported any weight gain. In fact, they  report she has felt "puffy" perhaps with fluid retention. She has not reported any neck pain. She has had headaches which have been ongoing for many months. She has also had chronic nausea which has been managed with Zofran; however, emesis was rare up until several days prior to admission. She had not been reporting any palpitations to family members. Prior to hospitalization, she had had no known recent exposure to iodinated contrast dye. They do not believe she had been taking any medications including herbals or supplements, other than what is noted below.   She has a movement disorder which was evaluated at [facility] neurology beginning this past summer. She has been seeing Dr. Mervyn Skeeters, neurology. The family recalls that the movement disorder is essentially a sense of being off balance, accompanied by the tremor. She also has been having difficulty forming words and sometimes stuttering. She was diagnosed with an essential tremor at [facility]. The family does not believe any labs were done at [facility]; however, I do recall that labs were done at [facility] as recent as June 2013.  PAST MEDICAL HISTORY:   1.  COPD/asthma.  2.  Hypertension.  3.  Cerebral meningioma, followed at [facility] by neurosurgery (Dr. Wende Mott).  4.  Essential tremor.  5.  Anxiety.   INPATIENT MEDICATIONS:  1.  Alprazolam 0.5 mg p.o. q. 8 hours.  2.  Wellbutrin SR 150 mg b.i.d.  3.  Celexa 40 mg daily.  4.  Digoxin 0.125 IV daily.  5.  Meropenem 1 gram IV q. 8 hours.  6.  Amiodarone IV infusion.  7.  Advair Diskus 150/50 mcg 1 puff b.i.d.   ALLERGIES:  NAPROXEN.   SOCIAL HISTORY:  She smokes cigarettes. No reported alcohol use.   FAMILY HISTORY:  Hypothyroidism and heart disease in her father. Atrial fibrillation in father. Hypothyroidism in aunt.   REVIEW OF SYSTEMS:  Unable to obtain.   PHYSICAL EXAMINATION: VITAL SIGNS: Height 62.9 inches, weight 226 pounds, 102.7 kg, BMI 40,  temperature 98.7, pulse 118, respirations 21, blood pressure 117/80, pulse oximetry 95% on a Venturi mask.  GENERAL: Sedated white female in no distress.  HEENT: Mucous membranes moist.  NECK: Supple. No appreciable thyromegaly, although exam was difficult.  CARDIAC: Tachycardia without audible murmur. No carotid bruit.  PULMONARY: Clear anteriorly bilaterally.  ABDOMEN: Tender, decreased bowel sounds.  EXTREMITIES: No edema is present.  NEUROLOGIC: Unable to assess.  PSYCHIATRIC: Unable to assess.   LABORATORY DATA:  Glucose 140, sodium 135, BUN 20, creatinine 1.12, CO2 is 28, calcium 7.9, phosphorus 2.2, magnesium 1.5. TSH is 0.134 and free T4 1.69. Platelets are 286, WBC 18.3, hematocrit 33.2.   ASSESSMENT:  A 54 year old female who is critically ill status post a right lower quadrant abscess followed by appendectomy and postoperative atrial fibrillation with rapid ventricular rate. She was found to have abnormal thyroid function tests specifically with a mildly low TSH and an elevated free T4. Labs are consistent with hyperthyroidism and most likely causes subacute thyroiditis in the setting of acute illness. Typically, this is clinically fairly asymptomatic and will self-resolve with improved clinical condition. Alternative causes of hyperthyroidism includes Graves' disease or multinodular goiter. She is not on any medications that would be suspected to be contributing to her hyperthyroidism. Amiodarone certainly can cause hyperthyroidism; however, it actually started after her tachycardia developed and is unlikely to be playing a role. Also, iodinated contrast was also just given within the last several days and while it can lead to hyperthyroidism in individuals especially with a multinodular goiter, this also seems somewhat unlikely, although a possibility.   RECOMMENDATIONS:   1.  I plan to obtain further thyroid labs including a total T3 and thyroid antibodies.  2.  I would like labs to be  requested from [facility] as I suspect she may have had thyroid function tests done as part of her workup for tremor last summer.  3.  I also plan to repeat a TSH tomorrow.  4.  No treatment is recommended at this time, but I will reassess her daily and follow along with the team.    ____________________________ A. Lavone Orn, MD ams:si D: 04/25/2012 16:42:07 ET T: 04/25/2012 17:57:44 ET JOB#: [id number]  cc: A. Lavone Orn, MD, <Dictator> Sherlon Handing MD ELECTRONICALLY SIGNED 04/28/2012 17:15

## 2014-07-12 NOTE — Op Note (Signed)
PATIENT NAME:  Molly Rogers, Molly Rogers MR#:  [id number] DATE OF BIRTH:  Jul 22, 1960  DATE OF PROCEDURE:  04/25/2012  PREOPERATIVE DIAGNOSIS: Sepsis.   POSTOPERATIVE DIAGNOSIS: Sepsis.   PROCEDURE: Right femoral vein central line placement.   SURGEON: Phoebe Perch, MD   ANESTHESIA: Local anesthetic.   INDICATIONS: This is a patient status post ruptured appendix, who is now septic, requiring pressors for blood pressure control. Preoperatively, we discussed the rationale for placement of a central line, the options of observation and the risks of bleeding, infection, recurrence of disease, and the choice of groin placement was discussed with her due to her morbid obesity, BMI of 40. The groin would be safer than the subclavian or neck.   FINDINGS: Placement of the central line in the right femoral vein without difficulty.   DESCRIPTION OF PROCEDURE: The patient was identified, a pause was performed, and the patient was then prepped and draped in sterile fashion. Cap, gown, and gloves were utilized throughout the procedure. Palpation of the right femoral pulse was performed. Local anesthetic was infiltrated in skin and subcutaneous tissues around this area, and then on the first attempt a large-bore needle was placed in the right femoral vein. A Seldinger wire was placed. The introducer dilator was placed, and then over the wire was placed the previously flushed triple-lumen catheter. The wire was removed. The lumens were aspirated and flushed for function, capped, and then the catheter was sutured to the skin of the thigh with the provided silk suture, and a sterile dressing was placed. The patient tolerated the procedure well. There were no complications. She was left in critical condition in the ICU.  ____________________________ Jerrol Banana. Burt Knack, MD rec:cb D: 04/25/2012 04:13:36 ET T: 04/25/2012 13:04:51 ET JOB#: [id number] cc: Jerrol Banana. Burt Knack, MD, <Dictator> Florene Glen MD ELECTRONICALLY  SIGNED 04/27/2012 1:24

## 2014-07-12 NOTE — Discharge Summary (Signed)
PATIENT NAME:  Molly Rogers, Molly Rogers MR#:  [id number] DATE OF BIRTH:  [birth year]  DATE OF ADMISSION:  04/21/2012 DATE OF DISCHARGE:  05/01/2012  BRIEF HISTORY: The patient is a 54 year old woman admitted through the Emergency Room with signs and symptoms consistent with acute ruptured appendicitis. She had been sick for several days prior to admission. CT workup and clinical presentation support the diagnosis. She has a history of a meningioma and chronic neurological problems, but these do not appear to be related to her current medical concern. She was taken urgently to the operating room early on the morning of 04/22/2012 where she underwent a laparoscopic appendectomy. She had a large abscess in the pelvis and was placed on JP drainage and continued on antibiotic therapy. She had a very slow return of bowel function and then suffered a significant episode of congestive heart failure on the afternoon of February 3rd. She was moved to the intensive care unit. She was evaluated by internal medicine, cardiology and endocrine. She had a very slow recovery from her respiratory embarrassment but did continue to respond to conservative therapy. She was transferred out to the floor on the 8th, begun on a regular diet on the 9th. She was out, active, ambulating without difficulty. Her wounds look good. Her atrial fibrillation is in control. There appear to be no significant complications. In discussing the case with [facility] hospitalist service today, we recommended discharge.   DISCHARGE MEDICATIONS: Include Advair Diskus 100 mcg/50 mcg b.i.d., Xanax 0.5 mg t.i.d. p.r.n., Wellbutrin 150 mg/12 hour extended release 2 times a day, Celexa 40 mg once a day, Motrin 800 mg every 8 hours p.r.n., Zofran 4 mg disintegrating tablet q.6 hours p.r.n., Percocet 5/325 every 4 to 6 hours p.r.n., amiodarone 200 mg b.i.d., Augmentin 875 mg b.i.d. and Levaquin 500 mg p.o. daily. She is no longer going to be taking her  hydrochlorothiazide, lisinopril, nifedipine or Cipro.   The patient is in agreement with this plan. Bathing, activity, and driving instructions were given to the patient. She is to return for followup in the office in 7 to 10 days' time.   FINAL DISCHARGE DIAGNOSES: Ruptured acute appendicitis, atrial fibrillation, congestive heart failure, respiratory insufficiency.   SURGERY: Laparoscopic appendectomy.     ____________________________ Rodena Goldmann III, MD rle:gb D: 05/01/2012 17:45:08 ET T: 05/02/2012 02:39:15 ET JOB#: [id number]  cc: Rodena Goldmann III, MD, <Dictator> Rodena Goldmann MD ELECTRONICALLY SIGNED 05/03/2012 18:05

## 2014-07-12 NOTE — Consult Note (Signed)
General Aspect 54 year old woman with a hx of atrial fibrillation, seen in the Emergency Room with abdominal pain,projectile vomiting, nausea, diarrhea and abdominal distention. history of meningioma, being evaluated at [facility],  CT scan demonstrated right lower quadrant abscess consistent with possible ruptured appendix. She had a significantly dilated appendix, periappendiceal inflammatory changes and what looked like a contained perforation. s/p surgery 2/1 2014 for ruptured appendicitis. This AM with tachycardia, rates >150. Transferred to CCU for evaluation and management. Cardiology was consulted for tachycardia.  Tender ABD, 7/10. Chronic tremor per the family.   In  the CCU thuis AM, rate acute increased to >150 bpm. Some cherst tightness, SOB. Hyopoxia, started on high flow oxygen. Adenosine 6 mg pushed for rate 170 bpm, narrow complex rapid. Atrial flutter noted during ventricular pause. Atrial rate 300 bpm.  No improvement in rate with diltiazem bolus and infusion 15 mg/hour.    Present Illness .  ALLERGIES: SHE IS ALLERGIC TO NAPROXEN.   SOCIAL HISTORY: She is a cigarette smoker. She does not drink significant amounts of alcohol.   Family hx:  no known CAD   Physical Exam:   GEN well developed, well nourished, no acute distress, obese    HEENT red conjunctivae    NECK supple  No masses    RESP normal resp effort  clear BS    CARD Tachycardic  No murmur    ABD denies tenderness  soft    EXTR negative edema    SKIN normal to palpation    NEURO motor/sensory function intact    PSYCH alert, A+O to time, place, person, good insight   Review of Systems:   Subjective/Chief Complaint SOB, ABD pain.    General: Fatigue  Weakness    Skin: No Complaints    ENT: No Complaints    Eyes: No Complaints    Neck: No Complaints    Respiratory: Short of breath    Cardiovascular: Palpitations  Dyspnea    Gastrointestinal: No Complaints    Genitourinary: No  Complaints    Vascular: No Complaints    Musculoskeletal: No Complaints    Neurologic: No Complaints    Hematologic: No Complaints    Endocrine: No Complaints    Psychiatric: No Complaints    Review of Systems: All other systems were reviewed and found to be negative    Medications/Allergies Reviewed Medications/Allergies reviewed        Admit Diagnosis:   ABDOMINAL PAIN PELVIC ABSCESS.: 24-Apr-2012, Active, ABDOMINAL PAIN PELVIC ABSCESS.  Home Medications: Medication Instructions Status  loperamide 2 mg oral tablet 2 tab(s) orally now, then 1 tab orally after each loose stool Active  Zofran ODT 4 mg oral tablet, disintegrating 1 tab(s) SL every 6 hours PRN Nausea   Active  Cipro 500 mg oral tablet 1 tab(s) PO 2 times a day Active  Motrin 800 mg oral tablet 1 tab(s) PO every 8 hours PRN Pain   Active  ProAir HFA 2 puff(s) orally every 6 hours, As Needed- for Shortness of Breath , for Wheezing  Active  Advair Diskus 100 mcg-50 mcg inhalation powder 1 puff(s) inhaled 2 times a day Active  Xanax 0.25 mg oral tablet 1 tab(s) orally 3 times a day, As Needed- for Anxiety Active  Wellbutrin SR 150 mg/12 hours oral tablet, extended release 1 tab(s) orally 2 times a day Active  hydrochlorothiazide-lisinopril 25 mg-20 mg oral tablet 1 tab(s) orally once a day Active  Nifedical XL 30 mg oral tablet, extended release 1  tab(s) orally once a day Active  Celexa 40 mg oral tablet 1 tab(s) orally once a day Active  ondansetron 8 mg oral tablet 1 tab(s) orally 3 times a day, As Needed- for Nausea Active   Lab Results:  Routine Chem:  03-Feb-14 04:09    B-Type Natriuretic Peptide [facility])  8401 (Result(s) reported on 24 Apr 2012 at 12:43PM.)   Glucose, Serum  132   BUN  24   Creatinine (comp) 1.19   Sodium, Serum 136   Potassium, Serum 3.9   Chloride, Serum 104   CO2, Serum 23   Calcium (Total), Serum  8.2   Anion Gap 9   Osmolality (calc) 278   eGFR (African American) >60   eGFR  (Non-African American)  52 (eGFR values <69m/min/1.73 m2 may be an indication of chronic kidney disease (CKD). Calculated eGFR is useful in patients with stable renal function. The eGFR calculation will not be reliable in acutely ill patients when serum creatinine is changing rapidly. It is not useful in  patients on dialysis. The eGFR calculation may not be applicable to patients at the low and high extremes of body sizes, pregnant women, and vegetarians.)  Cardiac:  03-Feb-14 04:09    CPK-MB, Serum - (Result(s) reported on 24 Apr 2012 at 01:06PM.)   Troponin I - (0.00-0.05 0.05 ng/mL or less: NEGATIVE  Repeat testing in 3-6 hrs  if clinically indicated. >0.05 ng/mL: POTENTIAL  MYOCARDIAL INJURY. Repeat  testing in 3-6 hrs if  clinically indicated. NOTE: An increase or decrease  of 30% or more on serial  testing suggests a  clinically important change)  Routine Hem:  03-Feb-14 04:09    WBC (CBC)  20.1   RBC (CBC) 4.07   Hemoglobin (CBC)  11.5   Hematocrit (CBC) 35.4   Platelet Count (CBC) 280   MCV 87   MCH 28.4   MCHC 32.6   RDW  14.9   Neutrophil % 86.7   Lymphocyte % 5.6   Monocyte % 7.4   Eosinophil % 0.1   Basophil % 0.2   Neutrophil #  17.5   Lymphocyte # 1.1   Monocyte #  1.5   Eosinophil # 0.0   Basophil # 0.0 (Result(s) reported on 24 Apr 2012 at 05:01AM.)   EKG:   Interpretation EKG this AM showing atria flutter with rate 150 bpm    Naproxen: Unknown  Vital Signs/Nurse's Notes: **Vital Signs.:   03-Feb-14 10:21   Vital Signs Type Q 4hr   Temperature Temperature (F) 98.2   Celsius 36.7   Temperature Source Oral   Pulse Pulse 159   Respirations Respirations 17   Systolic BP Systolic BP 1841  Diastolic BP (mmHg) Diastolic BP (mmHg) 80   Mean BP 91   Pulse Ox % Pulse Ox % 91   Pulse Ox Activity Level  At rest   Oxygen Delivery High Flow Nasal Cannula     Impression 54year old woman with a hx of atrial fibrillation, seen in the Emergency  Room with abdominal pain,projectile vomiting, nausea, diarrhea and abdominal distention. [facility]  [facility] demonstrated right lower quadrant abscess consistent with possible ruptured appendix. She had a significantly dilated appendix, periappendiceal inflammatory changes and what looked like a contained perforation. s/p surgery 2/1 2014. This AM with tachycardia, rates >150. Transferred to CCU for evaluation and management. Cardiology was consulted for tachycardia.  1) Atrial flutter rapid rate, >170 on diltiazem infusion Will start amiodarone  bolus and infusion. continue at 1 mg/min May need repeat bolus if no improvement in rate If hemodynamically compromised, more sx of SOB, could cardiovert echo pending CT chest pending to rule out PE  2)Ruptured appendicitis s/p surgery 7/10 pain management per Dr. Pat Patrick  3) HTN: hold po meds for now  4) COPD continue inhalers   Electronic Signatures: Ida Rogue (MD)  (Signed 03-Feb-14 13:34)  Authored: General Aspect/Present Illness, History and Physical Exam, Review of System, Health Issues, Home Medications, Labs, EKG , Allergies, Vital Signs/Nurse's Notes, Impression/Plan   Last Updated: 03-Feb-14 13:34 by Ida Rogue (MD)

## 2014-07-12 NOTE — Consult Note (Signed)
PATIENT NAME:  Molly Rogers, Molly Rogers MR#:  [id number] DATE OF BIRTH:  April 02, 1960  DATE OF CONSULTATION:  04/24/2012  REFERRING PHYSICIAN:  Marlyce Huge, MD CONSULTING PHYSICIAN:  Vivien Presto, MD  PRIMARY CARE PHYSICIAN: [facility].  REASON FOR CONSULTATION: Tachycardia, hypoxia.  HISTORY OF PRESENT ILLNESS: The patient is an obese 54 year old Caucasian female with history of COPD, asthma, and reactive air disease, and hypertension who was admitted to the hospital for nausea, vomiting, diarrhea, abdominal pain and initial CT scan had showed right lower quadrant abscess and a ruptured appendix and the patient was taken to the OR. The patient underwent appendectomy and was noted to have ruptured acute appendicitis, on February 1st. The patient had been on some IV fluids, pain medicines, Cipro and Flagyl. The patient was noted to have progressive hypoxia and currently is on high flow. This morning the patient was noted to have significant tachycardia with heart rate of 159. Furthermore, she had also dropped her O2 sats on high flow nasal cannula and medicine was consulted for further evaluation.   The patient of note has an EKG with rate of 170s and it is pretty much irregular. The patient has some palpitations, has some shortness of breath. Of note, the patient did receive a dose of Lasix yesterday as x-ray at that time had showed more of a wet lung and possible CHF type of a picture. She denies having any fevers or coughs, but has significant abdominal pain.   PAST MEDICAL HISTORY: 1. COPD.  2. Asthma.  3. Reactive airway disease. 4. Hypertension. 5. History of tubal ligation. 6. Carpal tunnel surgery. 7. History of essential tremor.  8. Per her history of meningioma for which she follows with [facility].  DRUG ALLERGIES: NAPROSYN.   FAMILY HISTORY: Hypertension, thyroid problems and skin cancer.   OUTPATIENT MEDICATIONS: 1. Advair 100/50 mcg 1 puff 2 times a day.   2. Celexa 40 mg daily.  3. Cipro 500 mg 2 times a day.  4. HCTZ/lisinopril 25/20 mg 1 tab once a day. 5. Loperamide 2 mg 2 tabs p.r.n. 6. Motrin p.r.n. 7. Nifedical XL 30 mg extended-release 1 tab daily. 8. Zofran p.r.n. 9. Pro Air HFA p.r.n. 10. Wellbutrin SR 150 mg 12 hour extended-release 1 tab 2 times a day.  11. Xanax 0.25 mg 1 tab 3 times a day p.r.n.  SOCIAL HISTORY: History of tobacco abuse. No alcohol or drug use.   REVIEW OF SYSTEMS: CONSTITUTIONAL: No fever. Positive for fatigue. EYES: No blurry vision or double vision.  ENT: No tinnitus or hearing loss.  RESPIRATORY: Positive for shortness of breath, COPD and asthma. No wheezing.  CARDIOVASCULAR: Has some palpitations without any chest pains. No swelling in the legs.  GASTROINTESTINAL: Recent abdominal surgery. Positive for right upper quadrant abdominal pain, at the side of the drain, nauseous.  GENITOURINARY: Denies dysuria but feels uncomfortable as she has a Foley.  ENDOCRINE: Denies polyuria or nocturia. HEMATOLOGIC AND LYMPHATIC:  No anemia or easy bruising.  SKIN: No new rashes.  MUSCULOSKELETAL: Has no back pain or gout.  NEUROLOGIC: Has history of essential tremor.  PSYCHIATRIC: Denies anxiety or insomnia now.  PHYSICAL EXAMINATION: VITAL SIGNS: Temperature this morning was 98.2 and last pulse was 159, per complete vital sheet, but while I was in the room it was in 170s. Respiratory rate 17, blood pressure 114/80 and O2 sat 91% on high flow. The patient currently is on mid 92s FiO2.  GENERAL: The patient is a morbidly obese, Caucasian female  lying in bed, ill-appearing.  HEENT: Normocephalic, atraumatic. Extraocular muscles intact. Dry mucous membranes.  NECK: Supple. No cervical lymphadenopathy. No thyroid tenderness.  CARDIOVASCULAR: S1 and S2 tachycardia, irregular. Unable to appreciate any murmurs, rubs or gallops.  LUNGS: Decreased breath sounds bilaterally. Some crackles, scattered. Good air entry.   ABDOMEN: Soft. The patient has a right upper quadrant drain and some tenderness around it without evidence for cellulitis. Hypoactive bowel sounds.  EXTREMITIES: No significant lower extremity edema.  SKIN: No rashes obvious. Homan sign is negative.  NEUROLOGIC: Cranial nerves II through XII grossly intact. Strength is 5 out of 5 in all extremities. Sensation is intact to light touch.  PSYCH: Awake, alert and oriented x 3. Cooperative.  LABS/RADIOLOGIC STUDIES: Glucose 132.  BNP 8401. BUN 24, creatinine 1.19 and of note creatinine was 1.67 yesterday, sodium 136 and potassium 3.9. WBC this morning was 20.1, hemoglobin 11.5 and platelets 280. Rapid flu on arrival was negative.   ABG done this afternoon is showing pH of 7.4, pCO2 of 40 and pO2 of 63 on high flow nasal cannula.   X-ray of the chest, PA and lateral, yesterday, is showing bilateral diffuse interstitial thickening likely representing interstitial edema versus interstitial pneumonitis.   X-ray of the chest this morning, PA and lateral, is showing pulmonary edema. No significant pleural fluid collection. There may be underlying COPD.   EKG is showing rate of 172, tachycardic, read as sinus. I see some P wave but unable to ascertain if this is SVT or flutter with a block. There are also some inferolateral T wave inversions.   ASSESSMENT AND PLAN: We have a morbidly obese 54 year old Caucasian female with history of chronic obstructive pulmonary disease, asthma, hypertension and reactive airway disease admitted for ruptured appendix, status post appendectomy. Currently on Cipro and Flagyl with progressive hypoxia, shortness of breath, sepsis, acute respiratory failure and tachycardia.  1. Tachycardia. The rate appeared to be irregular, in the 170s. The patient did not slow down with Cardizem 10 mg IV x 1 while in the CCU. The patient was transferred to CCU urgently. Adenosine 6 mg was ordered, as it was pushed, and flutter waves could be  seen. The patient does have history of atrial fibrillation per her, but has not seen any physician and has had no followup with cardiology. The patient was also evaluated by Dr. Rockey Situ from [facility] while I was in the room and at this point the patient is on a Cardizem drip, but fluid also add amiodarone with bolus. The etiology of the tachycardia and acute onset flutter is not clear. It could be congestive heart failure related versus pulmonary embolus related. Furthermore, it could be hypoxia related. Would attempt to bring down the heart rate through amiodarone and Cardizem drips at this point. We would appreciate cardiology's input on this in this regard. Cardizem drip has already been started.  2. Acute respiratory failure. The patient has been having progressive hypoxia, was on room air and currently on high flow nasal cannula. Etiology is unclear at this moment. The patient appears to have acute pulmonary edema, possibly acute congestive heart failure, unknown if this is systolic or diastolic given elevated BNP, which came back this morning after it was placed by me. The patient has congestive heart failure type of an x-ray, but has no significant lower extremity edema. The patient did get a dose of Lasix yesterday and would give another dose today. Other possible etiologies would be a pulmonary embolus. I have ordered a  CT with PE protocol as well as lower extremity Dopplers. Would also order an echocardiogram to assess for possible congestive heart failure at this point. The patient is on high flow nasal cannula. Would consult with pulmonary. ABG shows significant hypoxia. We have ordered a dose of Lovenox, therapeutic, until pulmonary embolus has been ruled out. I would continue the inhalers for the chronic obstructive pulmonary disease, but I doubt this is acute chronic obstructive pulmonary disease flare. Furthermore, there is no acute CO2 retention. There is no significant wheezing either.   3. Sepsis. The patient has tachycardia, leukocytosis. This is likely an abdominal source, but pneumonia is also a possibility, but at this point I think this CHF type of a picture is more likely than a pneumonia. ARDS is a possibility and we would monitor her oxygenation while in the CCU.  4. Hypertension. We would hold her HCTZ and lisinopril given that she is on Cardizem drip at this point to prevent hypotension.  5. Ruptured appendix. The patient is on Cipro and Flagyl. The patient has had appendectomy already and WBC did trend down, but is stable in the 20 range. The patient is afebrile. This will be managed by surgery.  6. We would recommend continuation of p.r.n. alprazolam, Wellbutrin and Celexa. The patient is critically ill and at high risk of cardiopulmonary complications and arrest. The patient's power of attorney is present at this point. She has been updated.  CODE STATUS: FULL CODE         TOTAL TIME SPENT: About 2 hours going over EKG and x-rays of the chest, talking to surgery and pulmonary as well as cardiology.  ____________________________ Vivien Presto, MD sa:sb D: 04/24/2012 13:44:58 ET T: 04/24/2012 14:14:40 ET JOB#: [id number]  cc: Vivien Presto, MD, <Dictator> Vivien Presto MD ELECTRONICALLY SIGNED 05/08/2012 15:48

## 2014-07-24 ENCOUNTER — Other Ambulatory Visit: Payer: Self-pay | Admitting: Family Medicine

## 2014-07-24 DIAGNOSIS — Z1231 Encounter for screening mammogram for malignant neoplasm of breast: Secondary | ICD-10-CM

## 2014-08-08 ENCOUNTER — Ambulatory Visit
Admission: RE | Admit: 2014-08-08 | Discharge: 2014-08-08 | Disposition: A | Discharge status: Home / Self Care | Payer: Medicaid Other | Source: Ambulatory Visit | Attending: Family Medicine | Admitting: Family Medicine

## 2014-08-08 DIAGNOSIS — Z1231 Encounter for screening mammogram for malignant neoplasm of breast: Secondary | ICD-10-CM | POA: Insufficient documentation

## 2014-08-13 DIAGNOSIS — M533 Sacrococcygeal disorders, not elsewhere classified: Secondary | ICD-10-CM | POA: Insufficient documentation

## 2014-10-30 ENCOUNTER — Telehealth: Payer: Self-pay

## 2014-10-30 NOTE — Telephone Encounter (Signed)
Request from  [facility], [facility] attorney, sent to Ruth on 10/30/2014 .

## 2014-12-02 ENCOUNTER — Ambulatory Visit: Payer: Medicaid Other | Admitting: Physical Therapy

## 2014-12-05 ENCOUNTER — Encounter: Payer: Medicaid Other | Admitting: Physical Therapy

## 2014-12-05 DIAGNOSIS — I10 Essential (primary) hypertension: Secondary | ICD-10-CM | POA: Insufficient documentation

## 2014-12-05 DIAGNOSIS — R251 Tremor, unspecified: Secondary | ICD-10-CM | POA: Insufficient documentation

## 2014-12-05 DIAGNOSIS — L309 Dermatitis, unspecified: Secondary | ICD-10-CM | POA: Insufficient documentation

## 2014-12-05 DIAGNOSIS — R51 Headache: Secondary | ICD-10-CM

## 2014-12-05 DIAGNOSIS — G4730 Sleep apnea, unspecified: Secondary | ICD-10-CM | POA: Insufficient documentation

## 2014-12-05 DIAGNOSIS — G459 Transient cerebral ischemic attack, unspecified: Secondary | ICD-10-CM | POA: Insufficient documentation

## 2014-12-05 DIAGNOSIS — F4310 Post-traumatic stress disorder, unspecified: Secondary | ICD-10-CM | POA: Insufficient documentation

## 2014-12-05 DIAGNOSIS — G8929 Other chronic pain: Secondary | ICD-10-CM | POA: Insufficient documentation

## 2014-12-05 DIAGNOSIS — R519 Headache, unspecified: Secondary | ICD-10-CM | POA: Insufficient documentation

## 2014-12-05 DIAGNOSIS — J449 Chronic obstructive pulmonary disease, unspecified: Secondary | ICD-10-CM | POA: Insufficient documentation

## 2014-12-05 DIAGNOSIS — R270 Ataxia, unspecified: Secondary | ICD-10-CM | POA: Insufficient documentation

## 2014-12-05 DIAGNOSIS — I4891 Unspecified atrial fibrillation: Secondary | ICD-10-CM

## 2014-12-05 DIAGNOSIS — R4701 Aphasia: Secondary | ICD-10-CM | POA: Insufficient documentation

## 2014-12-05 DIAGNOSIS — J849 Interstitial pulmonary disease, unspecified: Secondary | ICD-10-CM | POA: Insufficient documentation

## 2014-12-09 ENCOUNTER — Ambulatory Visit: Payer: Medicaid Other | Attending: Family Medicine | Admitting: Physical Therapy

## 2014-12-09 ENCOUNTER — Other Ambulatory Visit: Payer: Self-pay | Admitting: Internal Medicine

## 2014-12-09 DIAGNOSIS — G8929 Other chronic pain: Secondary | ICD-10-CM | POA: Diagnosis present

## 2014-12-09 DIAGNOSIS — R531 Weakness: Secondary | ICD-10-CM

## 2014-12-09 DIAGNOSIS — R6889 Other general symptoms and signs: Secondary | ICD-10-CM

## 2014-12-09 DIAGNOSIS — Z7409 Other reduced mobility: Secondary | ICD-10-CM | POA: Insufficient documentation

## 2014-12-09 DIAGNOSIS — M545 Low back pain, unspecified: Secondary | ICD-10-CM

## 2014-12-09 DIAGNOSIS — R262 Difficulty in walking, not elsewhere classified: Secondary | ICD-10-CM | POA: Diagnosis present

## 2014-12-09 DIAGNOSIS — M25552 Pain in left hip: Secondary | ICD-10-CM | POA: Diagnosis present

## 2014-12-09 DIAGNOSIS — R05 Cough: Secondary | ICD-10-CM

## 2014-12-09 DIAGNOSIS — R059 Cough, unspecified: Secondary | ICD-10-CM

## 2014-12-09 NOTE — Therapy (Signed)
[facility] [address] [place], [address] Phone: [phone]   Fax:  [phone]  Physical Therapy Evaluation  Patient Details  Name: Molly Rogers MRN: [id number] Date of Birth: 10/25/1960 Referring Provider:  Lorelee Market, MD  Encounter Date: 12/09/2014      PT End of Session - 12/09/14 1727    Visit Number 1   Number of Visits 1   PT Start Time 1420   PT Stop Time 1500   PT Time Calculation (min) 40 min   Activity Tolerance Patient tolerated treatment well;Patient limited by pain   Behavior During Therapy [facility] for tasks assessed/performed      Past Medical History  Diagnosis Date  . Hypertension   . A-fib   . Hyperlipidemia   . Asthma   . Cancer     skin cancer  . Ruptured appendix   . COPD (chronic obstructive pulmonary disease)   . Parkinson's disease (tremor, stiffness, slow motion, unstable posture)   . Brain tumor   . DJD (degenerative joint disease) of hip     Past Surgical History  Procedure Laterality Date  . Appendectomy    . Carpal tunnel release    . Finger surgery      x2    There were no vitals filed for this visit.  Visit Diagnosis:  Bilateral low back pain without sciatica - Plan: PT plan of care cert/re-cert  Hip pain, chronic, left - Plan: PT plan of care cert/re-cert  Difficulty walking - Plan: PT plan of care cert/re-cert  Decreased strength, endurance, and mobility - Plan: PT plan of care cert/re-cert      Subjective Assessment - 12/09/14 1509    Subjective Patient reports back and left hip pain. Patient reports it has been getting progressively worse. Rates pain at 7/10.   Limitations Sitting;Standing;Walking   How long can you sit comfortably? <30 min   How long can you stand comfortably? <30 min   How long can you walk comfortably? <5 min   Patient Stated Goals to be able to get around like she used to, decrease pain.    Pain Score 7     Pain Location Back   Pain Orientation Posterior;Lower   Pain Descriptors / Indicators Aching   Pain Type Chronic pain   Pain Onset More than a month ago   Pain Frequency Constant   Multiple Pain Sites Yes   Pain Score 7   Pain Location Hip   Pain Orientation Left   Pain Descriptors / Indicators Aching   Pain Type Chronic pain   Pain Onset More than a month ago   Pain Frequency Constant   Effect of Pain on Daily Activities unable to do much            [facility] PT Assessment - 12/09/14 0001    Assessment   Medical Diagnosis back pain and hip pain   Onset Date/Surgical Date 10/14/14   Precautions   Precautions None   Restrictions   Weight Bearing Restrictions No   Balance Screen   Has the patient fallen in the past 6 months No   Prior Function   Level of Independence Independent   Cognition   Overall Cognitive Status Within Functional Limits for tasks assessed   Observation/Other Assessments   Modified [place] 74%   Fear Avoidance Belief Questionnaire (FABQ)  23/30, 51/66 = 74/96   ROM / Strength   AROM / PROM / Strength AROM;Strength  AROM   Overall AROM Comments limited left hip flexion range of motion.   Strength   Overall Strength Due to pain;Deficits   Bed Mobility   Bed Mobility Supine to Sit   Supine to Sit 7: Independent   Transfers   Transfers Sit to Stand   Sit to Stand 7: Independent   Ambulation/Gait   Ambulation/Gait Yes   Ambulation Distance (Feet) 200 Feet   Gait Pattern Decreased step length - left   Ambulation Surface Level   Gait velocity 10 meters in  24 sec        Treatment to include: Supine low back exercises performed and handout given to patient for the following: Lumbar rotation, single knee to chest, bridging, pelvic tilts, and seated or standing hamstring/low back stretch.  patient walked for 200' when pain rose and patient was unable to continue walking.           PT Education - 12/09/14 1726    Education provided Yes    Education Details HEP given, encouraged pt to begin walking program if able.    Person(s) Educated Patient   Methods Explanation;Demonstration   Comprehension Verbalized understanding;Returned demonstration          PT Short Term Goals - 12/09/14 1731    PT SHORT TERM GOAL #1   Title patient will verbalize and demonstrate understanding of HEP.    Baseline no understanding of exercises for back pain or strengthening.    Time 1   Period Days   Status Achieved                  Plan - 12/09/14 1728    Clinical Impression Statement Patient is a 54 year old female who reports significant back pain at arrival to appointment 7/10. Patient is limited in her ability to tolerate exercise, or activity due to pain. Patient had increased pain to 8/10 with walking 200'. No change in pain with LB exercises/stretches.   Pt will benefit from skilled therapeutic intervention in order to improve on the following deficits Abnormal gait;Decreased activity tolerance;Pain;Decreased strength;Decreased mobility;Difficulty walking;Decreased endurance   Rehab Potential Fair   PT Frequency One time visit   PT Next Visit Plan 1x visit   Consulted and Agree with Plan of Care Patient         Problem List Patient Active Problem List   Diagnosis Date Noted  . Acquired aphasia 12/05/2014  . 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 Chronic headache 12/05/2014  . CAFL (chronic airflow limitation) 12/05/2014  . Dermatitis, eczematoid 12/05/2014  . BP (high blood pressure) 12/05/2014  . ILD (interstitial lung disease) 12/05/2014  . 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 Apnea, sleep 12/05/2014  . Transient cerebral ischemia due to atrial fibrillation 12/05/2014  . Has a tremor 12/05/2014  . Benign neoplasm of meninges 07/01/2014  . Derangement of knee 09/28/2013  . Neuritis or radiculitis due to rupture of lumbar intervertebral disc 08/20/2013  . Degenerative arthritis of lumbar spine 08/20/2013  .  Bursitis, trochanteric 08/20/2013  . MI (mitral incompetence) 08/17/2013  . Arthritis of knee, degenerative 08/16/2013  . Anxiety 11/29/2012  . CCF (congestive cardiac failure) 11/29/2012  . Benign essential tremor 11/29/2012  . SOB (shortness of breath) 05/08/2012  . Atrial flutter 05/08/2012  . Abdominal discomfort 05/08/2012  . Essential hypertension 05/08/2012    Conetta,Kristyn, PT, MPT, GCS 12/09/2014, 5:35 PM  [facility] [address], [place], [address] Phone: [phone]  Fax:  [phone]

## 2014-12-09 NOTE — Patient Instructions (Signed)
Pelvic Tilt  Lying on back with knees bent, Flatten back by tightening stomach muscles and rocking hips back Hold for 5 sec, Repeat __10__ times per set. Do __1__ sets per session. Do __2__ sessions per day.  http://orth.exer.us/134    Copyright  VHI. All rights reserved. Knee to Chest (Flexion)   Pull knee toward chest. Feel stretch in lower back or buttock area. Breathing deeply, Hold __15__ seconds. Repeat with other knee. Repeat _2-3___ times. Do _2-3___ sessions per day.  http://gt2.exer.us/225   Copyright  VHI. All rights reserved.   Lower Trunk Rotation Stretch  Lying on back with knees bent, Keeping back flat and feet together, rotate knees side to side slowly and in pain free range of motion.  Hold _2___ seconds. Repeat for 1-2 minutes. Do __1__ sets per session. Do __2-3__ sessions per day.  http://orth.exer.us/122   Copyright  VHI. All rights reserved.   Bridge   Lie back, legs bent. Inhale, pressing hips up.  Exhale, rolling down along spine from top. Repeat _10___ times. Do __1-2__ sessions per day.  Copyright  VHI. All rights reserved.    Supine: Leg Stretch With Strap (Basic)   Lie on back with one knee bent, foot flat on bed/ floor. Hook strap around other foot. Straighten knee.Lift leg until you feel a stretch behind the knee, but no pain. Hold _20-30__ seconds. Relax leg completely down to floor.  Repeat _2-3__ times per session. Do _2__ sessions per day.  Copyright  VHI. All rights reserved.

## 2014-12-10 ENCOUNTER — Encounter: Payer: Medicaid Other | Admitting: Physical Therapy

## 2014-12-11 ENCOUNTER — Ambulatory Visit (INDEPENDENT_AMBULATORY_CARE_PROVIDER_SITE_OTHER): Payer: Medicaid Other | Admitting: Surgery

## 2014-12-11 ENCOUNTER — Encounter: Payer: Self-pay | Admitting: Surgery

## 2014-12-11 VITALS — BP 97/66 | HR 92 | Temp 97.8°F | Ht 62.0 in | Wt 223.4 lb

## 2014-12-11 DIAGNOSIS — G8929 Other chronic pain: Secondary | ICD-10-CM | POA: Diagnosis not present

## 2014-12-11 DIAGNOSIS — R1031 Right lower quadrant pain: Secondary | ICD-10-CM | POA: Diagnosis not present

## 2014-12-11 NOTE — Progress Notes (Signed)
Surgical Consultation  12/11/2014  Molly Rogers is an 54 y.o. female.   Chief Complaint  Patient presents with  . Abdominal Pain    Laparoscopc Appendectomy (04/22/2012)- Dr. Pat Patrick     HPI: She underwent a laparoscopic appendectomy over 2 years ago for ruptured appendicitis and had a complicated postoperative course requiring long-term antibiotics ventilator management for some time. She recovered otherwise uneventfully and has not had any problems in the interim.  Proximally month ago she began to develop some intermittent right lower quadrant right suprapubic pain. The pain radiates occasionally to her back and to the left side. She's had no nausea or vomiting. She is having some stress urinary incontinence which is new. She is not having any bowel problems. She's not undergone any workup for this problem.  She denies any fever or chills. She has not had any other constitutional symptoms. She specifically denies any hepatitis yellow jaundice pancreatitis peptic ulcer disease or gallbladder disease.  Past Medical History  Diagnosis Date  . Hypertension   . A-fib   . Hyperlipidemia   . Asthma   . Ruptured appendix   . COPD (chronic obstructive pulmonary disease)   . Parkinson's disease (tremor, stiffness, slow motion, unstable posture)   . Brain tumor   . Cancer     skin cancer  . Bursitis     Left Hip  . DJD (degenerative joint disease) of hip     Right Hip and Spine; and Left Knee  . Chronic back pain     [facility]    Past Surgical History  Procedure Laterality Date  . Appendectomy    . Carpal tunnel release    . Finger surgery      x2    Family History  Problem Relation Age of Onset  . Heart disease Mother   . Heart disease Father   . Hypertension Father   . Hyperlipidemia Father   . Heart failure Father     Social History:  reports that she quit smoking about 18 months ago. Her smoking use included Cigarettes. She smoked 0.00 packs per day for  35 years. She has never used smokeless tobacco. She reports that she drinks about 0.6 oz of alcohol per week. She reports that she uses illicit drugs (Marijuana and Codeine).  Allergies:  Allergies  Allergen Reactions  . Amitriptyline Other (See Comments)    Extreme irritability and hallucinations  . Naproxen     Upset stomach  . Prednisone Other (See Comments)    Cannot take due to heart condition    Medications reviewed.     Review of Systems  Constitutional: Negative for fever, chills, weight loss and malaise/fatigue.  Eyes: Negative.   Respiratory: Negative.   Cardiovascular: Negative.   Gastrointestinal: Positive for abdominal pain. Negative for nausea and vomiting.  Genitourinary: Negative.   Musculoskeletal: Positive for back pain, joint pain and neck pain.  Skin: Negative.   Neurological: Negative.   Psychiatric/Behavioral: Negative.        BP 97/66 mmHg  Pulse 92  Temp(Src) 97.8 F (36.6 C) (Oral)  Ht 5\' 2"  (1.575 m)  Wt 101.334 kg (223 lb 6.4 oz)  BMI 40.85 kg/m2  Physical Exam  Constitutional: She is oriented to person, place, and time. She appears distressed.  HENT:  Head: Normocephalic and atraumatic.  Eyes: Conjunctivae are normal. Pupils are equal, round, and reactive to light.  Neck: Normal range of motion. Neck supple.  Cardiovascular: Normal rate and regular rhythm.  Pulmonary/Chest: Effort normal and breath sounds normal.  Abdominal: Soft. Bowel sounds are normal. She exhibits no distension. There is no tenderness. There is no rebound and no guarding.  Musculoskeletal: Normal range of motion. She exhibits no tenderness.  Neurological: She is alert and oriented to person, place, and time.  Skin: Skin is warm and dry.  Psychiatric: Judgment normal.  Flat affect    Her abdomen is generally soft with no significant point tenderness. She cannot really specify an area of discomfort. All resected symptoms are very generalized.  No results found  for this or any previous visit (from the past 48 hour(s)). No results found.  Assessment/Plan: 1. Abdominal pain, chronic, right lower quadrant I really don't have a good plan for her at this point. She's had some recent blood work was unremarkable and her primary care office. Nausea any significant clinical findings. I doubt this set of symptoms related to her appendicitis. She is set up to see her gynecologist next week. After that evaluation if there are no obvious findings we will consider a CT scan. She is in agreement with this plan. I am concerned about the possibility of urinary retention with her concomitant development of incontinence symptoms.   Dia Crawford III dermatitis

## 2014-12-11 NOTE — Patient Instructions (Addendum)
Follow-up with your Gynecologist next week as planned.   After this appointment, please call our office and we will set-up a CT scan to better idea of why you are having this pain.  After your CT scan, then you will need to come back in to the office and follow-up to review the results.

## 2014-12-16 ENCOUNTER — Ambulatory Visit: Payer: Medicaid Other | Admitting: Physical Therapy

## 2014-12-16 ENCOUNTER — Encounter: Payer: Medicaid Other | Admitting: Physical Therapy

## 2014-12-17 ENCOUNTER — Ambulatory Visit: Admission: RE | Admit: 2014-12-17 | Payer: Medicaid Other | Source: Ambulatory Visit

## 2014-12-17 ENCOUNTER — Ambulatory Visit
Admission: RE | Admit: 2014-12-17 | Discharge: 2014-12-17 | Disposition: A | Discharge status: Home / Self Care | Payer: Medicaid Other | Source: Ambulatory Visit | Attending: Internal Medicine | Admitting: Internal Medicine

## 2014-12-17 DIAGNOSIS — R059 Cough, unspecified: Secondary | ICD-10-CM

## 2014-12-17 DIAGNOSIS — R05 Cough: Secondary | ICD-10-CM | POA: Insufficient documentation

## 2014-12-18 ENCOUNTER — Ambulatory Visit: Payer: Medicaid Other | Attending: Anesthesiology | Admitting: Anesthesiology

## 2014-12-18 ENCOUNTER — Encounter: Payer: Self-pay | Admitting: Anesthesiology

## 2014-12-18 VITALS — HR 77 | Temp 98.5°F | Resp 16 | Ht 63.0 in | Wt 220.0 lb

## 2014-12-18 DIAGNOSIS — Z87891 Personal history of nicotine dependence: Secondary | ICD-10-CM | POA: Insufficient documentation

## 2014-12-18 DIAGNOSIS — I4891 Unspecified atrial fibrillation: Secondary | ICD-10-CM | POA: Insufficient documentation

## 2014-12-18 DIAGNOSIS — M797 Fibromyalgia: Secondary | ICD-10-CM

## 2014-12-18 DIAGNOSIS — M5136 Other intervertebral disc degeneration, lumbar region: Secondary | ICD-10-CM | POA: Diagnosis not present

## 2014-12-18 DIAGNOSIS — I4892 Unspecified atrial flutter: Secondary | ICD-10-CM | POA: Insufficient documentation

## 2014-12-18 DIAGNOSIS — G8929 Other chronic pain: Secondary | ICD-10-CM | POA: Diagnosis not present

## 2014-12-18 DIAGNOSIS — M7070 Other bursitis of hip, unspecified hip: Secondary | ICD-10-CM

## 2014-12-18 DIAGNOSIS — M545 Low back pain, unspecified: Secondary | ICD-10-CM

## 2014-12-18 DIAGNOSIS — M47817 Spondylosis without myelopathy or radiculopathy, lumbosacral region: Secondary | ICD-10-CM

## 2014-12-18 DIAGNOSIS — I483 Typical atrial flutter: Secondary | ICD-10-CM

## 2014-12-18 DIAGNOSIS — I340 Nonrheumatic mitral (valve) insufficiency: Secondary | ICD-10-CM | POA: Insufficient documentation

## 2014-12-18 DIAGNOSIS — M47816 Spondylosis without myelopathy or radiculopathy, lumbar region: Secondary | ICD-10-CM | POA: Diagnosis not present

## 2014-12-18 DIAGNOSIS — M7072 Other bursitis of hip, left hip: Secondary | ICD-10-CM

## 2014-12-18 DIAGNOSIS — M51369 Other intervertebral disc degeneration, lumbar region without mention of lumbar back pain or lower extremity pain: Secondary | ICD-10-CM

## 2014-12-18 HISTORY — DX: Low back pain, unspecified: M54.50

## 2014-12-18 HISTORY — DX: Spondylosis without myelopathy or radiculopathy, lumbosacral region: M47.817

## 2014-12-18 HISTORY — DX: Other intervertebral disc degeneration, lumbar region without mention of lumbar back pain or lower extremity pain: M51.369

## 2014-12-18 HISTORY — DX: Other bursitis of hip, unspecified hip: M70.70

## 2014-12-18 HISTORY — DX: Other intervertebral disc degeneration, lumbar region: M51.36

## 2014-12-18 MED ORDER — TRAMADOL HCL 50 MG PO TABS: 50.0000 mg | ORAL_TABLET | Freq: Three times a day (TID) | ORAL | Status: DC

## 2014-12-18 NOTE — Patient Instructions (Signed)
You were given a prescription for Tramadol today. GENERAL RISKS AND COMPLICATIONS  What are the risk, side effects and possible complications? Generally speaking, most procedures are safe.  However, with any procedure there are risks, side effects, and the possibility of complications.  The risks and complications are dependent upon the sites that are lesioned, or the type of nerve block to be performed.  The closer the procedure is to the spine, the more serious the risks are.  Great care is taken when placing the radio frequency needles, block needles or lesioning probes, but sometimes complications can occur. 1. Infection: Any time there is an injection through the skin, there is a risk of infection.  This is why sterile conditions are used for these blocks.  There are four possible types of infection. 1. Localized skin infection. 2. Central Nervous System Infection-This can be in the form of Meningitis, which can be deadly. 3. Epidural Infections-This can be in the form of an epidural abscess, which can cause pressure inside of the spine, causing compression of the spinal cord with subsequent paralysis. This would require an emergency surgery to decompress, and there are no guarantees that the patient would recover from the paralysis. 4. Discitis-This is an infection of the intervertebral discs.  It occurs in about 1% of discography procedures.  It is difficult to treat and it may lead to surgery.        2. Pain: the needles have to go through skin and soft tissues, will cause soreness.       3. Damage to internal structures:  The nerves to be lesioned may be near blood vessels or    other nerves which can be potentially damaged.       4. Bleeding: Bleeding is more common if the patient is taking blood thinners such as  aspirin, Coumadin, Ticiid, Plavix, etc., or if he/she have some genetic predisposition  such as hemophilia. Bleeding into the spinal canal can cause compression of the spinal  cord  with subsequent paralysis.  This would require an emergency surgery to  decompress and there are no guarantees that the patient would recover from the  paralysis.       5. Pneumothorax:  Puncturing of a lung is a possibility, every time a needle is introduced in  the area of the chest or upper back.  Pneumothorax refers to free air around the  collapsed lung(s), inside of the thoracic cavity (chest cavity).  Another two possible  complications related to a similar event would include: Hemothorax and Chylothorax.   These are variations of the Pneumothorax, where instead of air around the collapsed  lung(s), you may have blood or chyle, respectively.       6. Spinal headaches: They may occur with any procedures in the area of the spine.       7. Persistent CSF (Cerebro-Spinal Fluid) leakage: This is a rare problem, but may occur  with prolonged intrathecal or epidural catheters either due to the formation of a fistulous  track or a dural tear.       8. Nerve damage: By working so close to the spinal cord, there is always a possibility of  nerve damage, which could be as serious as a permanent spinal cord injury with  paralysis.       9. Death:  Although rare, severe deadly allergic reactions known as "Anaphylactic  reaction" can occur to any of the medications used.      10. Worsening of the  symptoms:  We can always make thing worse.  What are the chances of something like this happening? Chances of any of this occuring are extremely low.  By statistics, you have more of a chance of getting killed in a motor vehicle accident: while driving to the hospital than any of the above occurring .  Nevertheless, you should be aware that they are possibilities.  In general, it is similar to taking a shower.  Everybody knows that you can slip, hit your head and get killed.  Does that mean that you should not shower again?  Nevertheless always keep in mind that statistics do not mean anything if you happen to be on the  wrong side of them.  Even if a procedure has a 1 (one) in a 1,000,000 (million) chance of going wrong, it you happen to be that one..Also, keep in mind that by statistics, you have more of a chance of having something go wrong when taking medications.  Who should not have this procedure? If you are on a blood thinning medication (e.g. Coumadin, Plavix, see list of "Blood Thinners"), or if you have an active infection going on, you should not have the procedure.  If you are taking any blood thinners, please inform your physician.  How should I prepare for this procedure?  Do not eat or drink anything at least six hours prior to the procedure.  Bring a driver with you .  It cannot be a taxi.  Come accompanied by an adult that can drive you back, and that is strong enough to help you if your legs get weak or numb from the local anesthetic.  Take all of your medicines the morning of the procedure with just enough water to swallow them.  If you have diabetes, make sure that you are scheduled to have your procedure done first thing in the morning, whenever possible.  If you have diabetes, take only half of your insulin dose and notify our nurse that you have done so as soon as you arrive at the clinic.  If you are diabetic, but only take blood sugar pills (oral hypoglycemic), then do not take them on the morning of your procedure.  You may take them after you have had the procedure.  Do not take aspirin or any aspirin-containing medications, at least eleven (11) days prior to the procedure.  They may prolong bleeding.  Wear loose fitting clothing that may be easy to take off and that you would not mind if it got stained with Betadine or blood.  Do not wear any jewelry or perfume  Remove any nail coloring.  It will interfere with some of our monitoring equipment.  NOTE: Remember that this is not meant to be interpreted as a complete list of all possible complications.  Unforeseen problems may  occur.  BLOOD THINNERS The following drugs contain aspirin or other products, which can cause increased bleeding during surgery and should not be taken for 2 weeks prior to and 1 week after surgery.  If you should need take something for relief of minor pain, you may take acetaminophen which is found in Tylenol,m Datril, Anacin-3 and Panadol. It is not blood thinner. The products listed below are.  Do not take any of the products listed below in addition to any listed on your instruction sheet.  A.P.C or A.P.C with Codeine Codeine Phosphate Capsules #3 Ibuprofen Ridaura  ABC compound Congesprin Imuran rimadil  Advil Cope Indocin Robaxisal  Alka-Seltzer Effervescent Pain Reliever  and Antacid Coricidin or Coricidin-D  Indomethacin Rufen  Alka-Seltzer plus Cold Medicine Cosprin Ketoprofen S-A-C Tablets  Anacin Analgesic Tablets or Capsules Coumadin Korlgesic Salflex  Anacin Extra Strength Analgesic tablets or capsules CP-2 Tablets Lanoril Salicylate  Anaprox Cuprimine Capsules Levenox Salocol  Anexsia-D Dalteparin Magan Salsalate  Anodynos Darvon compound Magnesium Salicylate Sine-off  Ansaid Dasin Capsules Magsal Sodium Salicylate  Anturane Depen Capsules Marnal Soma  APF Arthritis pain formula Dewitt's Pills Measurin Stanback  Argesic Dia-Gesic Meclofenamic Sulfinpyrazone  Arthritis Bayer Timed Release Aspirin Diclofenac Meclomen Sulindac  Arthritis pain formula Anacin Dicumarol Medipren Supac  Analgesic (Safety coated) Arthralgen Diffunasal Mefanamic Suprofen  Arthritis Strength Bufferin Dihydrocodeine Mepro Compound Suprol  Arthropan liquid Dopirydamole Methcarbomol with Aspirin Synalgos  ASA tablets/Enseals Disalcid Micrainin Tagament  Ascriptin Doan's Midol Talwin  Ascriptin A/D Dolene Mobidin Tanderil  Ascriptin Extra Strength Dolobid Moblgesic Ticlid  Ascriptin with Codeine Doloprin or Doloprin with Codeine Momentum Tolectin  Asperbuf Duoprin Mono-gesic Trendar  Aspergum Duradyne  Motrin or Motrin IB Triminicin  Aspirin plain, buffered or enteric coated Durasal Myochrisine Trigesic  Aspirin Suppositories Easprin Nalfon Trillsate  Aspirin with Codeine Ecotrin Regular or Extra Strength Naprosyn Uracel  Atromid-S Efficin Naproxen Ursinus  Auranofin Capsules Elmiron Neocylate Vanquish  Axotal Emagrin Norgesic Verin  Azathioprine Empirin or Empirin with Codeine Normiflo Vitamin E  Azolid Emprazil Nuprin Voltaren  Bayer Aspirin plain, buffered or children's or timed BC Tablets or powders Encaprin Orgaran Warfarin Sodium  Buff-a-Comp Enoxaparin Orudis Zorpin  Buff-a-Comp with Codeine Equegesic Os-Cal-Gesic   Buffaprin Excedrin plain, buffered or Extra Strength Oxalid   Bufferin Arthritis Strength Feldene Oxphenbutazone   Bufferin plain or Extra Strength Feldene Capsules Oxycodone with Aspirin   Bufferin with Codeine Fenoprofen Fenoprofen Pabalate or Pabalate-SF   Buffets II Flogesic Panagesic   Buffinol plain or Extra Strength Florinal or Florinal with Codeine Panwarfarin   Buf-Tabs Flurbiprofen Penicillamine   Butalbital Compound Four-way cold tablets Penicillin   Butazolidin Fragmin Pepto-Bismol   Carbenicillin Geminisyn Percodan   Carna Arthritis Reliever Geopen Persantine   Carprofen Gold's salt Persistin   Chloramphenicol Goody's Phenylbutazone   Chloromycetin Haltrain Piroxlcam   Clmetidine heparin Plaquenil   Cllnoril Hyco-pap Ponstel   Clofibrate Hydroxy chloroquine Propoxyphen         Before stopping any of these medications, be sure to consult the physician who ordered them.  Some, such as Coumadin (Warfarin) are ordered to prevent or treat serious conditions such as "deep thrombosis", "pumonary embolisms", and other heart problems.  The amount of time that you may need off of the medication may also vary with the medication and the reason for which you were taking it.  If you are taking any of these medications, please make sure you notify your pain  physician before you undergo any procedures.         Epidural Steroid Injection Patient Information  Description: The epidural space surrounds the nerves as they exit the spinal cord.  In some patients, the nerves can be compressed and inflamed by a bulging disc or a tight spinal canal (spinal stenosis).  By injecting steroids into the epidural space, we can bring irritated nerves into direct contact with a potentially helpful medication.  These steroids act directly on the irritated nerves and can reduce swelling and inflammation which often leads to decreased pain.  Epidural steroids may be injected anywhere along the spine and from the neck to the low back depending upon the location of your pain.   After numbing the skin with  local anesthetic (like Novocaine), a small needle is passed into the epidural space slowly.  You may experience a sensation of pressure while this is being done.  The entire block usually last less than 10 minutes.  Conditions which may be treated by epidural steroids:   Low back and leg pain  Neck and arm pain  Spinal stenosis  Post-laminectomy syndrome  Herpes zoster (shingles) pain  Pain from compression fractures  Preparation for the injection:  1. Do not eat any solid food or dairy products within 6 hours of your appointment.  2. You may drink clear liquids up to 2 hours before appointment.  Clear liquids include water, black coffee, juice or soda.  No milk or cream please. 3. You may take your regular medication, including pain medications, with a sip of water before your appointment  Diabetics should hold regular insulin (if taken separately) and take 1/2 normal NPH dos the morning of the procedure.  Carry some sugar containing items with you to your appointment. 4. A driver must accompany you and be prepared to drive you home after your procedure.  5. Bring all your current medications with your. 6. An IV may be inserted and sedation may be given  at the discretion of the physician.   7. A blood pressure cuff, EKG and other monitors will often be applied during the procedure.  Some patients may need to have extra oxygen administered for a short period. 8. You will be asked to provide medical information, including your allergies, prior to the procedure.  We must know immediately if you are taking blood thinners (like Coumadin/Warfarin)  Or if you are allergic to IV iodine contrast (dye). We must know if you could possible be pregnant.  Possible side-effects:  Bleeding from needle site  Infection (rare, may require surgery)  Nerve injury (rare)  Numbness & tingling (temporary)  Difficulty urinating (rare, temporary)  Spinal headache ( a headache worse with upright posture)  Light -headedness (temporary)  Pain at injection site (several days)  Decreased blood pressure (temporary)  Weakness in arm/leg (temporary)  Pressure sensation in back/neck (temporary)  Call if you experience:  Fever/chills associated with headache or increased back/neck pain.  Headache worsened by an upright position.  New onset weakness or numbness of an extremity below the injection site  Hives or difficulty breathing (go to the emergency room)  Inflammation or drainage at the infection site  Severe back/neck pain  Any new symptoms which are concerning to you  Please note:  Although the local anesthetic injected can often make your back or neck feel good for several hours after the injection, the pain will likely return.  It takes 3-7 days for steroids to work in the epidural space.  You may not notice any pain relief for at least that one week.  If effective, we will often do a series of three injections spaced 3-6 weeks apart to maximally decrease your pain.  After the initial series, we generally will wait several months before considering a repeat injection of the same type.  If you have any questions, please call [phone] [facility]

## 2014-12-18 NOTE — Progress Notes (Signed)
Subjective:    Patient ID: Molly Rogers, female    DOB: [birth year], 54 y.o.   MRN: [id number]  This is a pleasant and delightful 54 year old lady who comes in with chronic low  Back pain. She indicates that her back pain ishe has been associated with arthritis and bursitis and she has been suffering with this problem for the past 5 years. She indicated that the pain started spontaneously and is not associated with any trauma.   She indicated that the pain is well localized and does not radiate into her legs She has associated hip pain which is due to bursitis and arthritis of the hips She describes the pain as sharp and burning and aching Her subjective pain intensity rating is 60%  Pain medication The patient indicates that she used to go to the  [facility] clinic and there she was treated with Lyrica which does not appear to be helping her. She indicated that she had a nerve bloc in her back and by her description it appears to be a sacral iliac joint injection.  This procedure did give her some partial relief. She indicates that she used to take Percocet 10 mg and this was effective.  She also took morphine which affected her ability to concentrate.  Other medications Other medications include losartan Buproprion pravastatin Citalopram Xanax and Amitiza Advair and Spiriva  Allergies She is allergic to amitriptyline steroids and Aleve  Past medical history Past medical history is positive for  Fibromyalgia, hypertension,  atrial flutter, depression, anxiety, appendicular ataxia, chronic airflow limitation, interstitial lung disease and mitral incompetence  post traumatic neurosis sciatica sleep apnea trochanteric bursitis, transient cerebral ischemia secondary to atrial fibrillation and lumbar degenerative disc disease.  Past surgical history Past surgical history is positive for bilateral carpal tunnel syndrome, surgery on the right middle finger, appendectomy and tubal  ligation.  Social and economic history This patient smokes half a pack of cigaret and she's been doing this for 35 years.  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 Currently she is unemployed and is in the process of filing for Social security disability  Family history This patient has been divorced for 6 years She is para 2 + 0 with 2 sons aged 65 of 21 and are both alive and well Currently she lives with her second son Her mother is alive at age 59 but  has hypertension Her father is deceased at age 47 from the complications of congestive heart failure He has no brothers She has one sister age 49 and is alive but has mental issues  Imaging  She had MRIs of her knees and her lumbar spine but these results are not available to me at this time.   HPI    Review of Systems  Constitutional: Negative.   HENT: Negative.   Eyes: Negative.   Respiratory:       She has had interstitial  lung disease and sleep apnea  Cardiovascular: Negative for chest pain, palpitations and leg swelling.       T.his patient has had mitral incompetence and also atrial fibrillation  Gastrointestinal: Negative.   Endocrine: Negative.   Genitourinary: Negative.   Musculoskeletal: Positive for myalgias, back pain, joint swelling, arthralgias and gait problem.       She is had chronic low back pain which is localized to the back area  She has had bilateral hip pain secondary to bursitis and arthritis  Skin: Negative.   Allergic/Immunologic: Negative.   Neurological:       He has had posttraumatic neurosis and sciatica. She has also had transient cerebral ischemia secondary to atrial fibrillation  Hematological: Negative.   Psychiatric/Behavioral: Negative.        Objective:   Physical Exam  Constitutional: She is oriented to person, place, and time. She appears well-developed and well-nourished. No  distress.  HENT:  Head: Normocephalic and atraumatic.  Right Ear: External ear normal.  Left Ear: External ear normal.  Nose: Nose normal.  Mouth/Throat: Oropharynx is clear and moist. No oropharyngeal exudate.  Eyes: Conjunctivae and EOM are normal. Pupils are equal, round, and reactive to light. Right eye exhibits no discharge. Left eye exhibits no discharge. No scleral icterus.  Neck: Normal range of motion. Neck supple. No JVD present. No tracheal deviation present. No thyromegaly present.  Cardiovascular: Normal rate, regular rhythm, normal heart sounds and intact distal pulses.  Exam reveals no gallop and no friction rub.   No murmur heard. Her pulse was 77 bpm Equal and regular Heart sounds 1 and 2 were limited in all areas There were no audible murmurs  Pulmonary/Chest: Effort normal and breath sounds normal. No respiratory distress. She has no wheezes. She has no rales. She exhibits no tenderness.  Abdominal: She exhibits no distension and no mass. There is no tenderness. There is no rebound and no guarding.  Genitourinary:  Genitourinary urinary examination was deferred  Musculoskeletal: Normal range of motion. She exhibits no edema or tenderness.  Lymphadenopathy:    She has no cervical adenopathy.  Neurological: She is alert and oriented to person, place, and time. She has normal reflexes. She displays normal reflexes. No cranial nerve deficit. She exhibits normal muscle tone. Coordination normal.  Skin: Skin is warm and dry. No rash noted. She is not diaphoretic. No erythema. No pallor.  Psychiatric: She has a normal mood and affect. Her behavior is normal. Judgment and thought content normal.  Nursing note and vitals reviewed.         Assessment & Plan:     Assessment 1 chronic low back pain 2 lumbar spondylosis without radiculopathy 3 lumbar degenerative disc disease 4 fibromyalgia 5 status post atrial flutter atrial fibrillation and mitral  incompetence   Plan of management 1 Will plan a caudal epidural injection without steroid 2 Will consider intravenous lidocaine injection 3 Will begin the patient on tramadol 50 mg 3 times a day after meals   New patient   level four   [place].

## 2014-12-18 NOTE — Progress Notes (Signed)
Safety precautions to be maintained throughout the outpatient stay will include: orient to surroundings, keep bed in low position, maintain call bell within reach at all times, provide assistance with transfer out of bed and ambulation.  

## 2014-12-19 ENCOUNTER — Ambulatory Visit: Payer: Medicaid Other | Admitting: Physical Therapy

## 2014-12-19 ENCOUNTER — Encounter: Payer: Medicaid Other | Admitting: Physical Therapy

## 2014-12-19 DIAGNOSIS — N393 Stress incontinence (female) (male): Secondary | ICD-10-CM | POA: Insufficient documentation

## 2014-12-19 DIAGNOSIS — N951 Menopausal and female climacteric states: Secondary | ICD-10-CM | POA: Insufficient documentation

## 2014-12-19 DIAGNOSIS — R252 Cramp and spasm: Secondary | ICD-10-CM | POA: Insufficient documentation

## 2014-12-19 DIAGNOSIS — N939 Abnormal uterine and vaginal bleeding, unspecified: Secondary | ICD-10-CM | POA: Insufficient documentation

## 2014-12-20 ENCOUNTER — Ambulatory Visit: Payer: Medicaid Other | Attending: Anesthesiology | Admitting: Anesthesiology

## 2014-12-20 ENCOUNTER — Encounter: Payer: Self-pay | Admitting: Anesthesiology

## 2014-12-20 VITALS — BP 102/65 | HR 73 | Temp 98.3°F | Resp 18 | Ht 62.0 in | Wt 222.0 lb

## 2014-12-20 DIAGNOSIS — G8929 Other chronic pain: Secondary | ICD-10-CM | POA: Diagnosis present

## 2014-12-20 DIAGNOSIS — M545 Low back pain, unspecified: Secondary | ICD-10-CM

## 2014-12-20 DIAGNOSIS — M47816 Spondylosis without myelopathy or radiculopathy, lumbar region: Secondary | ICD-10-CM | POA: Diagnosis not present

## 2014-12-20 DIAGNOSIS — M47817 Spondylosis without myelopathy or radiculopathy, lumbosacral region: Secondary | ICD-10-CM

## 2014-12-20 DIAGNOSIS — M7061 Trochanteric bursitis, right hip: Secondary | ICD-10-CM

## 2014-12-20 DIAGNOSIS — M5136 Other intervertebral disc degeneration, lumbar region: Secondary | ICD-10-CM | POA: Insufficient documentation

## 2014-12-20 DIAGNOSIS — G545 Neuralgic amyotrophy: Secondary | ICD-10-CM | POA: Diagnosis present

## 2014-12-20 NOTE — Patient Instructions (Signed)
Pain Management Discharge Instructions  General Discharge Instructions :  If you need to reach your doctor call: Monday-Friday 8:00 am - 4:00 pm at [phone] or toll free [phone].  After clinic hours [phone] to have operator reach doctor.  Bring all of your medication bottles to all your appointments in the pain clinic.  To cancel or reschedule your appointment with Pain Management please remember to call 24 hours in advance to avoid a fee.  Refer to the educational materials which you have been given on: General Risks, I had my Procedure. Discharge Instructions, Post Sedation.  Post Procedure Instructions:  The drugs you were given will stay in your system until tomorrow, so for the next 24 hours you should not drive, make any legal decisions or drink any alcoholic beverages.  You may eat anything you prefer, but it is better to start with liquids then soups and crackers, and gradually work up to solid foods.  Please notify your doctor immediately if you have any unusual bleeding, trouble breathing or pain that is not related to your normal pain.  Depending on the type of procedure that was done, some parts of your body may feel week and/or numb.  This usually clears up by tonight or the next day.  Walk with the use of an assistive device or accompanied by an adult for the 24 hours.  You may use ice on the affected area for the first 24 hours.  Put ice in a Ziploc bag and cover with a towel and place against area 15 minutes on 15 minutes off.  You may switch to heat after 24 hours.GENERAL RISKS AND COMPLICATIONS  What are the risk, side effects and possible complications? Generally speaking, most procedures are safe.  However, with any procedure there are risks, side effects, and the possibility of complications.  The risks and complications are dependent upon the sites that are lesioned, or the type of nerve block to be performed.  The closer the procedure is to the spine,  the more serious the risks are.  Great care is taken when placing the radio frequency needles, block needles or lesioning probes, but sometimes complications can occur. 1. Infection: Any time there is an injection through the skin, there is a risk of infection.  This is why sterile conditions are used for these blocks.  There are four possible types of infection. 1. Localized skin infection. 2. Central Nervous System Infection-This can be in the form of Meningitis, which can be deadly. 3. Epidural Infections-This can be in the form of an epidural abscess, which can cause pressure inside of the spine, causing compression of the spinal cord with subsequent paralysis. This would require an emergency surgery to decompress, and there are no guarantees that the patient would recover from the paralysis. 4. Discitis-This is an infection of the intervertebral discs.  It occurs in about 1% of discography procedures.  It is difficult to treat and it may lead to surgery.        2. Pain: the needles have to go through skin and soft tissues, will cause soreness.       3. Damage to internal structures:  The nerves to be lesioned may be near blood vessels or    other nerves which can be potentially damaged.       4. Bleeding: Bleeding is more common if the patient is taking blood thinners such as  aspirin, Coumadin, Ticiid, Plavix, etc., or if he/she have some genetic predisposition  such as   hemophilia. Bleeding into the spinal canal can cause compression of the spinal  cord with subsequent paralysis.  This would require an emergency surgery to  decompress and there are no guarantees that the patient would recover from the  paralysis.       5. Pneumothorax:  Puncturing of a lung is a possibility, every time a needle is introduced in  the area of the chest or upper back.  Pneumothorax refers to free air around the  collapsed lung(s), inside of the thoracic cavity (chest cavity).  Another two possible  complications  related to a similar event would include: Hemothorax and Chylothorax.   These are variations of the Pneumothorax, where instead of air around the collapsed  lung(s), you may have blood or chyle, respectively.       6. Spinal headaches: They may occur with any procedures in the area of the spine.       7. Persistent CSF (Cerebro-Spinal Fluid) leakage: This is a rare problem, but may occur  with prolonged intrathecal or epidural catheters either due to the formation of a fistulous  track or a dural tear.       8. Nerve damage: By working so close to the spinal cord, there is always a possibility of  nerve damage, which could be as serious as a permanent spinal cord injury with  paralysis.       9. Death:  Although rare, severe deadly allergic reactions known as "Anaphylactic  reaction" can occur to any of the medications used.      10. Worsening of the symptoms:  We can always make thing worse.  What are the chances of something like this happening? Chances of any of this occuring are extremely low.  By statistics, you have more of a chance of getting killed in a motor vehicle accident: while driving to the hospital than any of the above occurring .  Nevertheless, you should be aware that they are possibilities.  In general, it is similar to taking a shower.  Everybody knows that you can slip, hit your head and get killed.  Does that mean that you should not shower again?  Nevertheless always keep in mind that statistics do not mean anything if you happen to be on the wrong side of them.  Even if a procedure has a 1 (one) in a 1,000,000 (million) chance of going wrong, it you happen to be that one..Also, keep in mind that by statistics, you have more of a chance of having something go wrong when taking medications.  Who should not have this procedure? If you are on a blood thinning medication (e.g. Coumadin, Plavix, see list of "Blood Thinners"), or if you have an active infection going on, you should not  have the procedure.  If you are taking any blood thinners, please inform your physician.  How should I prepare for this procedure?  Do not eat or drink anything at least six hours prior to the procedure.  Bring a driver with you .  It cannot be a taxi.  Come accompanied by an adult that can drive you back, and that is strong enough to help you if your legs get weak or numb from the local anesthetic.  Take all of your medicines the morning of the procedure with just enough water to swallow them.  If you have diabetes, make sure that you are scheduled to have your procedure done first thing in the morning, whenever possible.  If you have diabetes,   take only half of your insulin dose and notify our nurse that you have done so as soon as you arrive at the clinic.  If you are diabetic, but only take blood sugar pills (oral hypoglycemic), then do not take them on the morning of your procedure.  You may take them after you have had the procedure.  Do not take aspirin or any aspirin-containing medications, at least eleven (11) days prior to the procedure.  They may prolong bleeding.  Wear loose fitting clothing that may be easy to take off and that you would not mind if it got stained with Betadine or blood.  Do not wear any jewelry or perfume  Remove any nail coloring.  It will interfere with some of our monitoring equipment.  NOTE: Remember that this is not meant to be interpreted as a complete list of all possible complications.  Unforeseen problems may occur.  BLOOD THINNERS The following drugs contain aspirin or other products, which can cause increased bleeding during surgery and should not be taken for 2 weeks prior to and 1 week after surgery.  If you should need take something for relief of minor pain, you may take acetaminophen which is found in Tylenol,m Datril, Anacin-3 and Panadol. It is not blood thinner. The products listed below are.  Do not take any of the products listed below  in addition to any listed on your instruction sheet.  A.P.C or A.P.C with Codeine Codeine Phosphate Capsules #3 Ibuprofen Ridaura  ABC compound Congesprin Imuran rimadil  Advil Cope Indocin Robaxisal  Alka-Seltzer Effervescent Pain Reliever and Antacid Coricidin or Coricidin-D  Indomethacin Rufen  Alka-Seltzer plus Cold Medicine Cosprin Ketoprofen S-A-C Tablets  Anacin Analgesic Tablets or Capsules Coumadin Korlgesic Salflex  Anacin Extra Strength Analgesic tablets or capsules CP-2 Tablets Lanoril Salicylate  Anaprox Cuprimine Capsules Levenox Salocol  Anexsia-D Dalteparin Magan Salsalate  Anodynos Darvon compound Magnesium Salicylate Sine-off  Ansaid Dasin Capsules Magsal Sodium Salicylate  Anturane Depen Capsules Marnal Soma  APF Arthritis pain formula Dewitt's Pills Measurin Stanback  Argesic Dia-Gesic Meclofenamic Sulfinpyrazone  Arthritis Bayer Timed Release Aspirin Diclofenac Meclomen Sulindac  Arthritis pain formula Anacin Dicumarol Medipren Supac  Analgesic (Safety coated) Arthralgen Diffunasal Mefanamic Suprofen  Arthritis Strength Bufferin Dihydrocodeine Mepro Compound Suprol  Arthropan liquid Dopirydamole Methcarbomol with Aspirin Synalgos  ASA tablets/Enseals Disalcid Micrainin Tagament  Ascriptin Doan's Midol Talwin  Ascriptin A/D Dolene Mobidin Tanderil  Ascriptin Extra Strength Dolobid Moblgesic Ticlid  Ascriptin with Codeine Doloprin or Doloprin with Codeine Momentum Tolectin  Asperbuf Duoprin Mono-gesic Trendar  Aspergum Duradyne Motrin or Motrin IB Triminicin  Aspirin plain, buffered or enteric coated Durasal Myochrisine Trigesic  Aspirin Suppositories Easprin Nalfon Trillsate  Aspirin with Codeine Ecotrin Regular or Extra Strength Naprosyn Uracel  Atromid-S Efficin Naproxen Ursinus  Auranofin Capsules Elmiron Neocylate Vanquish  Axotal Emagrin Norgesic Verin  Azathioprine Empirin or Empirin with Codeine Normiflo Vitamin E  Azolid Emprazil Nuprin Voltaren  Bayer  Aspirin plain, buffered or children's or timed BC Tablets or powders Encaprin Orgaran Warfarin Sodium  Buff-a-Comp Enoxaparin Orudis Zorpin  Buff-a-Comp with Codeine Equegesic Os-Cal-Gesic   Buffaprin Excedrin plain, buffered or Extra Strength Oxalid   Bufferin Arthritis Strength Feldene Oxphenbutazone   Bufferin plain or Extra Strength Feldene Capsules Oxycodone with Aspirin   Bufferin with Codeine Fenoprofen Fenoprofen Pabalate or Pabalate-SF   Buffets II Flogesic Panagesic   Buffinol plain or Extra Strength Florinal or Florinal with Codeine Panwarfarin   Buf-Tabs Flurbiprofen Penicillamine   Butalbital Compound Four-way cold tablets   Penicillin   Butazolidin Fragmin Pepto-Bismol   Carbenicillin Geminisyn Percodan   Carna Arthritis Reliever Geopen Persantine   Carprofen Gold's salt Persistin   Chloramphenicol Goody's Phenylbutazone   Chloromycetin Haltrain Piroxlcam   Clmetidine heparin Plaquenil   Cllnoril Hyco-pap Ponstel   Clofibrate Hydroxy chloroquine Propoxyphen         Before stopping any of these medications, be sure to consult the physician who ordered them.  Some, such as Coumadin (Warfarin) are ordered to prevent or treat serious conditions such as "deep thrombosis", "pumonary embolisms", and other heart problems.  The amount of time that you may need off of the medication may also vary with the medication and the reason for which you were taking it.  If you are taking any of these medications, please make sure you notify your pain physician before you undergo any procedures.         Epidural Steroid Injection An epidural steroid injection is given to relieve pain in your neck, back, or legs that is caused by the irritation or swelling of a nerve root. This procedure involves injecting a steroid and numbing medicine (anesthetic) into the epidural space. The epidural space is the space between the outer covering of your spinal cord and the bones that form your backbone  (vertebra).  LET YOUR HEALTH CARE PROVIDER KNOW ABOUT:  2. Any allergies you have. 3. All medicines you are taking, including vitamins, herbs, eye drops, creams, and over-the-counter medicines such as aspirin. 4. Previous problems you or members of your family have had with the use of anesthetics. 5. Any blood disorders or blood clotting disorders you have. 6. Previous surgeries you have had. 7. Medical conditions you have. RISKS AND COMPLICATIONS Generally, this is a safe procedure. However, as with any procedure, complications can occur. Possible complications of epidural steroid injection include:  Headache.  Bleeding.  Infection.  Allergic reaction to the medicines.  Damage to your nerves. The response to this procedure depends on the underlying cause of the pain and its duration. People who have long-term (chronic) pain are less likely to benefit from epidural steroids than are those people whose pain comes on strong and suddenly. BEFORE THE PROCEDURE   Ask your health care provider about changing or stopping your regular medicines. You may be advised to stop taking blood-thinning medicines a few days before the procedure.  You may be given medicines to reduce anxiety.  Arrange for someone to take you home after the procedure. PROCEDURE   You will remain awake during the procedure. You may receive medicine to make you relaxed.  You will be asked to lie on your stomach.  The injection site will be cleaned.  The injection site will be numbed with a medicine (local anesthetic).  A needle will be injected through your skin into the epidural space.  Your health care provider will use an X-ray machine to ensure that the steroid is delivered closest to the affected nerve. You may have minimal discomfort at this time.  Once the needle is in the right position, the local anesthetic and the steroid will be injected into the epidural space.  The needle will then be removed and a  bandage will be applied to the injection site. AFTER THE PROCEDURE  12. You may be monitored for a short time before you go home. 13. You may feel weakness or numbness in your arm or leg, which disappears within hours. 14. You may be allowed to eat, drink, and take your regular   medicine. 15. You may have soreness at the site of the injection. Document Released: 06/15/2007 Document Revised: 11/08/2012 Document Reviewed: 08/25/2012 [facility] Patient Information 2015 [place], [place]. This information is not intended to replace advice given to you by your health care provider. Make sure you discuss any questions you have with your health care provider.

## 2014-12-20 NOTE — Progress Notes (Signed)
   Subjective:    Patient ID: Kirt Boys, female    DOB: 10-22-1960, 54 y.o.   MRN: [id number] This patient came in for her procedure today which was scheduled as a caudal epidural steroid injection without steroid Today she came in with her power of attorney who unfortunately was not with her when she came for initial evaluation and she had many questions understandably about her multiple medical problems in the past I spent about 20 minutes with her attempting to answer all her questions and she was satisfied with the answers given She had some concerns about the procedure but I explained it to her and she to was satisfied along with the patient with my explanation Unfortunately the patient had breakfast and I told her I couldn't do the procedure without sedation but would use generous local infiltration of local anesthetics He opted to reschedule the procedure for the next week She also had some concerns about fentanyl been used as an intravenous sedative and I told her because the patient had breakfast would not be using any sedation I also pointed out that in view of the patient's past pulmonary difficulties not be using fentanyl as part of my sedation regimen I told how would only use intravenous Versed. HPI    Review of Systems  Constitutional: Negative.   HENT: Negative.   Eyes: Negative.   Respiratory: Negative.   Cardiovascular: Negative.   Gastrointestinal: Negative.   Endocrine: Negative.   Genitourinary: Negative.   Musculoskeletal: Negative.   Skin: Negative.   Allergic/Immunologic: Negative.   Neurological: Negative.   Hematological: Negative.   Psychiatric/Behavioral: Negative.    The patient had multiple issues as recorded in my initial evaluation of 12/18/2014 but there were no new issues as far as her review of systems was considered    Objective:   Physical Exam  Cardiovascular:  Her blood pressure was 102/65 mmHg Her pulse was 73 bpm Equal and  regular Heart sounds 1 and 2 were heard in all areas and there were no audible murmurs Temperature was 98.21F Respirations were 18 breaths per minute Chest is clinically clear and there are no adventitious sounds Were no new neurological or musculoskeletal findings  Nursing note and vitals reviewed.         Assessment & Plan:   Assessment 1 chronic low back pain 2 lumbar degenerative disc disease 3 lumbar sacral spondylosis  Plan of management We'll plan to reschedule the caudal epidural steroid injection without steroids for her in the next week I will also plan not to use fentanyl as part of her sedative regimen   Established patient       level II   Lance Bosch M.D.

## 2014-12-20 NOTE — Progress Notes (Signed)
Safety precautions to be maintained throughout the outpatient stay will include: orient to surroundings, keep bed in low position, maintain call bell within reach at all times, provide assistance with transfer out of bed and ambulation.  

## 2014-12-27 ENCOUNTER — Ambulatory Visit: Payer: Medicaid Other | Admitting: Anesthesiology

## 2014-12-31 ENCOUNTER — Other Ambulatory Visit: Payer: Self-pay | Admitting: Anesthesiology

## 2015-01-07 ENCOUNTER — Telehealth: Payer: Self-pay | Admitting: Surgery

## 2015-01-07 NOTE — Telephone Encounter (Signed)
Returned patient call and talk with her caregiver Katharine Look. I informed Katharine Look that the patient should go to the urogynecologist that she was referred to by her gynecologist and rule out bladder / urinary tract issues as a source of her pain. If the patient is still having RLQ pain after receiving treatment for any bladder / urinary tract concerns, she should call our office for a follow up appointment. Katharine Look confirmed understanding of information and direction.

## 2015-01-07 NOTE — Telephone Encounter (Signed)
Katharine Look who is patients care taker has called, And requesting a CT, According to Dr.Ely's last note he recommended patient to go the her GYN and see what they said and he possibly order a CT, please contact patient

## 2015-03-20 ENCOUNTER — Telehealth: Payer: Self-pay | Admitting: Surgery

## 2015-03-20 NOTE — Telephone Encounter (Signed)
error 

## 2015-04-01 ENCOUNTER — Encounter: Payer: Self-pay | Admitting: General Surgery

## 2015-04-01 DIAGNOSIS — J449 Chronic obstructive pulmonary disease, unspecified: Secondary | ICD-10-CM | POA: Insufficient documentation

## 2015-04-01 DIAGNOSIS — J849 Interstitial pulmonary disease, unspecified: Secondary | ICD-10-CM | POA: Insufficient documentation

## 2015-04-03 ENCOUNTER — Ambulatory Visit: Payer: Medicaid Other | Admitting: General Surgery

## 2015-04-14 ENCOUNTER — Telehealth: Payer: Self-pay

## 2015-04-14 NOTE — Telephone Encounter (Signed)
Spoke with [name] at this time from Dr. Etta Quill office. She states that patient has a pulmonary appointment tomorrow and she can make an appointment to establish Dr. Humphrey Rolls as a PCP at that time.  Returned phone call to patient at this time. She states that she will go to her appointment and take care of this at that time. She thanked me for the call.

## 2015-04-14 NOTE — Telephone Encounter (Signed)
Called to speak with patient about current symptoms that she is having. She states that she is having RLQ and Right Mid-Abdomen Pain after having a bowel movement and severe constipation but she is currently taking Amitiza and does not understand why constipation is occuring while taking this medication. She occasionally has bright red blood after having a bowel movement. Her bladder incontinence that she was sent to [facility] for is getting better as they have patient taking a new medication that she cannot remember the name of and she follows up with them in 3 months. She has recently changed PCP's to Dr. Clayborn Bigness but does not have an appointment with her just yet. I explained to the patient that the pain she is having sounds more likely to be from a GI standpoint and that she would need to follow-up with her PCP to obtain a referral to be seen with a GI specialist for the constipation and Pain if she wishes to pursue that. Per patient, she has not ever had a colonoscopy.  Call was made to [facility] nurse [phone] at this time to explain the situation above, no answer. Left voicemail for return phone call from their nurse.

## 2015-04-17 ENCOUNTER — Ambulatory Visit: Payer: Medicaid Other | Admitting: General Surgery

## 2015-05-07 DIAGNOSIS — I10 Essential (primary) hypertension: Secondary | ICD-10-CM | POA: Insufficient documentation

## 2015-05-07 DIAGNOSIS — G4733 Obstructive sleep apnea (adult) (pediatric): Secondary | ICD-10-CM | POA: Insufficient documentation

## 2015-05-07 DIAGNOSIS — I480 Paroxysmal atrial fibrillation: Secondary | ICD-10-CM | POA: Insufficient documentation

## 2015-05-07 DIAGNOSIS — R0681 Apnea, not elsewhere classified: Secondary | ICD-10-CM | POA: Insufficient documentation

## 2015-05-07 DIAGNOSIS — E782 Mixed hyperlipidemia: Secondary | ICD-10-CM | POA: Insufficient documentation

## 2015-06-04 ENCOUNTER — Telehealth: Payer: Self-pay | Admitting: Surgery

## 2015-06-04 NOTE — Telephone Encounter (Signed)
Annetta Maw, her Poa, called yelling because the patient cancelled her appointment and she wants [place] to stop calling her so she won't cancel. After talking to [employer] I told patient that we need a note from her a doctor stating she is incapacitated or not of sound mind to make her own decisions. 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 I did make another appointment for [place] but told Katharine Look if she didn't come to this appointment we may not schedule any more. We would have to check with the nurse or doctor. She said she understood

## 2015-06-06 ENCOUNTER — Ambulatory Visit: Payer: Medicaid Other | Admitting: Surgery

## 2015-06-08 ENCOUNTER — Other Ambulatory Visit: Payer: Self-pay

## 2015-06-09 ENCOUNTER — Encounter: Payer: Self-pay | Admitting: Surgery

## 2015-06-09 ENCOUNTER — Ambulatory Visit: Payer: Medicaid Other | Attending: Pain Medicine | Admitting: Pain Medicine

## 2015-06-09 ENCOUNTER — Ambulatory Visit (INDEPENDENT_AMBULATORY_CARE_PROVIDER_SITE_OTHER): Payer: Medicaid Other | Admitting: Surgery

## 2015-06-09 ENCOUNTER — Encounter: Payer: Self-pay | Admitting: Pain Medicine

## 2015-06-09 ENCOUNTER — Telehealth: Payer: Self-pay

## 2015-06-09 ENCOUNTER — Other Ambulatory Visit
Admission: RE | Admit: 2015-06-09 | Discharge: 2015-06-09 | Disposition: A | Discharge status: Home / Self Care | Payer: Medicaid Other | Source: Ambulatory Visit | Attending: Surgery | Admitting: Surgery

## 2015-06-09 VITALS — BP 130/85 | HR 88 | Temp 97.2°F | Ht 62.0 in | Wt 240.0 lb

## 2015-06-09 VITALS — BP 116/83 | HR 89 | Temp 99.0°F | Resp 18 | Ht 63.0 in | Wt 240.0 lb

## 2015-06-09 DIAGNOSIS — F419 Anxiety disorder, unspecified: Secondary | ICD-10-CM | POA: Diagnosis not present

## 2015-06-09 DIAGNOSIS — I509 Heart failure, unspecified: Secondary | ICD-10-CM | POA: Diagnosis not present

## 2015-06-09 DIAGNOSIS — I10 Essential (primary) hypertension: Secondary | ICD-10-CM | POA: Diagnosis not present

## 2015-06-09 DIAGNOSIS — R32 Unspecified urinary incontinence: Secondary | ICD-10-CM | POA: Diagnosis not present

## 2015-06-09 DIAGNOSIS — M160 Bilateral primary osteoarthritis of hip: Secondary | ICD-10-CM | POA: Diagnosis not present

## 2015-06-09 DIAGNOSIS — M79606 Pain in leg, unspecified: Secondary | ICD-10-CM

## 2015-06-09 DIAGNOSIS — M533 Sacrococcygeal disorders, not elsewhere classified: Secondary | ICD-10-CM | POA: Diagnosis not present

## 2015-06-09 DIAGNOSIS — R1084 Generalized abdominal pain: Secondary | ICD-10-CM | POA: Insufficient documentation

## 2015-06-09 DIAGNOSIS — R1011 Right upper quadrant pain: Secondary | ICD-10-CM | POA: Diagnosis not present

## 2015-06-09 DIAGNOSIS — I480 Paroxysmal atrial fibrillation: Secondary | ICD-10-CM | POA: Diagnosis not present

## 2015-06-09 DIAGNOSIS — G8929 Other chronic pain: Secondary | ICD-10-CM | POA: Diagnosis not present

## 2015-06-09 DIAGNOSIS — L309 Dermatitis, unspecified: Secondary | ICD-10-CM | POA: Diagnosis not present

## 2015-06-09 DIAGNOSIS — G250 Essential tremor: Secondary | ICD-10-CM | POA: Diagnosis not present

## 2015-06-09 DIAGNOSIS — K219 Gastro-esophageal reflux disease without esophagitis: Secondary | ICD-10-CM | POA: Insufficient documentation

## 2015-06-09 DIAGNOSIS — M7731 Calcaneal spur, right foot: Secondary | ICD-10-CM | POA: Diagnosis not present

## 2015-06-09 DIAGNOSIS — M5126 Other intervertebral disc displacement, lumbar region: Secondary | ICD-10-CM | POA: Diagnosis not present

## 2015-06-09 DIAGNOSIS — M25552 Pain in left hip: Secondary | ICD-10-CM | POA: Insufficient documentation

## 2015-06-09 DIAGNOSIS — M7060 Trochanteric bursitis, unspecified hip: Secondary | ICD-10-CM | POA: Insufficient documentation

## 2015-06-09 DIAGNOSIS — G4730 Sleep apnea, unspecified: Secondary | ICD-10-CM | POA: Diagnosis not present

## 2015-06-09 DIAGNOSIS — M25562 Pain in left knee: Secondary | ICD-10-CM

## 2015-06-09 DIAGNOSIS — J449 Chronic obstructive pulmonary disease, unspecified: Secondary | ICD-10-CM | POA: Insufficient documentation

## 2015-06-09 DIAGNOSIS — Z79891 Long term (current) use of opiate analgesic: Secondary | ICD-10-CM | POA: Diagnosis not present

## 2015-06-09 DIAGNOSIS — R51 Headache: Secondary | ICD-10-CM | POA: Diagnosis not present

## 2015-06-09 DIAGNOSIS — Z87891 Personal history of nicotine dependence: Secondary | ICD-10-CM | POA: Insufficient documentation

## 2015-06-09 DIAGNOSIS — M545 Low back pain, unspecified: Secondary | ICD-10-CM

## 2015-06-09 DIAGNOSIS — M47816 Spondylosis without myelopathy or radiculopathy, lumbar region: Secondary | ICD-10-CM | POA: Diagnosis not present

## 2015-06-09 DIAGNOSIS — M797 Fibromyalgia: Secondary | ICD-10-CM | POA: Insufficient documentation

## 2015-06-09 DIAGNOSIS — E66813 Obesity, class 3: Secondary | ICD-10-CM

## 2015-06-09 DIAGNOSIS — E876 Hypokalemia: Secondary | ICD-10-CM | POA: Insufficient documentation

## 2015-06-09 DIAGNOSIS — R937 Abnormal findings on diagnostic imaging of other parts of musculoskeletal system: Secondary | ICD-10-CM

## 2015-06-09 DIAGNOSIS — M7732 Calcaneal spur, left foot: Secondary | ICD-10-CM | POA: Diagnosis not present

## 2015-06-09 DIAGNOSIS — M25559 Pain in unspecified hip: Secondary | ICD-10-CM | POA: Diagnosis present

## 2015-06-09 DIAGNOSIS — I4892 Unspecified atrial flutter: Secondary | ICD-10-CM | POA: Insufficient documentation

## 2015-06-09 DIAGNOSIS — R936 Abnormal findings on diagnostic imaging of limbs: Secondary | ICD-10-CM

## 2015-06-09 DIAGNOSIS — J45909 Unspecified asthma, uncomplicated: Secondary | ICD-10-CM | POA: Diagnosis not present

## 2015-06-09 DIAGNOSIS — M2242 Chondromalacia patellae, left knee: Secondary | ICD-10-CM

## 2015-06-09 DIAGNOSIS — Z6841 Body Mass Index (BMI) 40.0 and over, adult: Secondary | ICD-10-CM | POA: Insufficient documentation

## 2015-06-09 DIAGNOSIS — M542 Cervicalgia: Secondary | ICD-10-CM

## 2015-06-09 DIAGNOSIS — R4701 Aphasia: Secondary | ICD-10-CM | POA: Diagnosis not present

## 2015-06-09 DIAGNOSIS — F329 Major depressive disorder, single episode, unspecified: Secondary | ICD-10-CM | POA: Diagnosis not present

## 2015-06-09 DIAGNOSIS — M7730 Calcaneal spur, unspecified foot: Secondary | ICD-10-CM

## 2015-06-09 DIAGNOSIS — M170 Bilateral primary osteoarthritis of knee: Secondary | ICD-10-CM | POA: Insufficient documentation

## 2015-06-09 DIAGNOSIS — M549 Dorsalgia, unspecified: Secondary | ICD-10-CM | POA: Diagnosis present

## 2015-06-09 DIAGNOSIS — M25551 Pain in right hip: Secondary | ICD-10-CM | POA: Diagnosis not present

## 2015-06-09 DIAGNOSIS — D320 Benign neoplasm of cerebral meninges: Secondary | ICD-10-CM | POA: Diagnosis not present

## 2015-06-09 DIAGNOSIS — I340 Nonrheumatic mitral (valve) insufficiency: Secondary | ICD-10-CM | POA: Insufficient documentation

## 2015-06-09 DIAGNOSIS — M4806 Spinal stenosis, lumbar region: Secondary | ICD-10-CM

## 2015-06-09 DIAGNOSIS — E7801 Familial hypercholesterolemia: Secondary | ICD-10-CM | POA: Diagnosis not present

## 2015-06-09 DIAGNOSIS — M48061 Spinal stenosis, lumbar region without neurogenic claudication: Secondary | ICD-10-CM

## 2015-06-09 DIAGNOSIS — M25561 Pain in right knee: Secondary | ICD-10-CM

## 2015-06-09 DIAGNOSIS — M25569 Pain in unspecified knee: Secondary | ICD-10-CM | POA: Diagnosis present

## 2015-06-09 DIAGNOSIS — D329 Benign neoplasm of meninges, unspecified: Secondary | ICD-10-CM

## 2015-06-09 LAB — CBC WITH DIFFERENTIAL/PLATELET
BASOS ABS: 0.1 10*3/uL (ref 0–0.1)
Basophils Relative: 1 %
Eosinophils Absolute: 0.1 10*3/uL (ref 0–0.7)
Eosinophils Relative: 1 %
HEMATOCRIT: 46.7 % (ref 35.0–47.0)
HEMOGLOBIN: 15.9 g/dL (ref 12.0–16.0)
Lymphocytes Relative: 33 %
Lymphs Abs: 3.1 10*3/uL (ref 1.0–3.6)
MCH: 29.1 pg (ref 26.0–34.0)
MCHC: 34.1 g/dL (ref 32.0–36.0)
MCV: 85.3 fL (ref 80.0–100.0)
Monocytes Absolute: 0.8 10*3/uL (ref 0.2–0.9)
Monocytes Relative: 8 %
NEUTROS ABS: 5.2 10*3/uL (ref 1.4–6.5)
NEUTROS PCT: 57 %
Platelets: 291 10*3/uL (ref 150–440)
RBC: 5.47 MIL/uL — ABNORMAL HIGH (ref 3.80–5.20)
RDW: 14.2 % (ref 11.5–14.5)
WBC: 9.3 10*3/uL (ref 3.6–11.0)

## 2015-06-09 LAB — COMPREHENSIVE METABOLIC PANEL
ALBUMIN: 4 g/dL (ref 3.5–5.0)
ALK PHOS: 73 U/L (ref 38–126)
ALT: 30 U/L (ref 14–54)
AST: 26 U/L (ref 15–41)
Anion gap: 7 (ref 5–15)
BUN: 16 mg/dL (ref 6–20)
CO2: 28 mmol/L (ref 22–32)
CREATININE: 0.97 mg/dL (ref 0.44–1.00)
Calcium: 8.9 mg/dL (ref 8.9–10.3)
Chloride: 99 mmol/L — ABNORMAL LOW (ref 101–111)
GFR calc Af Amer: >60 mL/min (ref >60)
GLUCOSE: 100 mg/dL — AB (ref 65–99)
Potassium: 2.9 mmol/L — CL (ref 3.5–5.1)
Sodium: 134 mmol/L — ABNORMAL LOW (ref 135–145)
TOTAL PROTEIN: 7.6 g/dL (ref 6.5–8.1)
Total Bilirubin: 0.5 mg/dL (ref 0.3–1.2)

## 2015-06-09 LAB — URINALYSIS COMPLETE WITH MICROSCOPIC (ARMC ONLY)
BILIRUBIN URINE: NEGATIVE
Bacteria, UA: NONE SEEN
GLUCOSE, UA: NEGATIVE mg/dL
Hgb urine dipstick: NEGATIVE
KETONES UR: NEGATIVE mg/dL
Leukocytes, UA: NEGATIVE
Nitrite: NEGATIVE
Protein, ur: NEGATIVE mg/dL
RBC / HPF: NONE SEEN RBC/hpf (ref 0–5)
SPECIFIC GRAVITY, URINE: 1.01 (ref 1.005–1.030)
pH: 6.5 (ref 5.0–8.0)

## 2015-06-09 MED ORDER — POTASSIUM CHLORIDE CRYS ER 20 MEQ PO TBCR: 40.0000 meq | EXTENDED_RELEASE_TABLET | Freq: Two times a day (BID) | ORAL | Status: DC

## 2015-06-09 NOTE — Progress Notes (Signed)
Patient's Name: Cass Noble MRN: [id number] DOB: 12/24/1960 DOS: 06/09/2015  Primary Reason(s) for Visit: Initial Patient Evaluation CC: Back Pain; Hip Pain; and Knee Pain   HPI  Ms. Diazdeleon is a 55 y.o. year old, female patient, who comes today for an initial evaluation. She has SOB (shortness of breath); Atrial flutter ([place]); Abdominal discomfort; Essential hypertension; Anxiety; Acquired aphasia; Appendicular ataxia; CCF (congestive cardiac failure) ([place]); Chronic headache; CAFL (chronic airflow limitation) ([place]); Dermatitis, eczematoid; Benign essential tremor; BP (high blood pressure); ILD (interstitial lung disease) ([place]); Neuritis or radiculitis due to rupture of lumbar intervertebral disc; Benign neoplasm of meninges (HCC); MI (mitral incompetence); Neurosis, posttraumatic; Apnea, sleep; Transient cerebral ischemia due to atrial fibrillation ([place]); Has a tremor; Chronic trochanteric bursitis; Fibromyalgia; Atrial fibrillation ([place]); Abnormal uterine bleeding; Congestive heart failure ([place]); Chronic obstructive pulmonary disease ([place]); Interstitial lung disease ([place]); Neoplasm of meninges (); Spasm; Female climacteric state; Sacrococcygeal disorders, not elsewhere classified; Female genuine stress incontinence; Breathlessness on exertion; Paroxysmal atrial fibrillation ([place]); Obstructive apnea; Combined fat and carbohydrate induced hyperlipemia; Benign essential HTN; Chronic pain; Long term current use of opiate analgesic; Lumbar spondylosis; Chronic lower extremity pain (Location of Tertiary source of pain) (Bilateral) (L>R); Chronic low back pain (Location of Primary Source of Pain) (Bilateral) (Midline) (R>L); Chronic hip pain (Location of Secondary source of pain) (Bilateral) (R>L); Osteoarthritis of hips (Location of Secondary source of pain) (Bilateral) (R>L); Chronic knee pain (Bilateral) (L>R); Chronic neck pain (posterior and central); Osteoarthritis of knees (Bilateral)  (L>R); Obesity, Class III, BMI 40-49.9 (morbid obesity) ([place]); Abnormal MRI, lumbar spine (06/13/2013); Lumbar facet arthropathy; Lumbar facet syndrome; Lumbar central spinal stenosis (L4-5); Lumbar foraminal stenosis (Bilateral) (L5-S1); Heel spurs (Bilateral); Abnormal MRI, knee (11/16/2013) (Left); Chondromalacia patellae of knee (Left); Left frontal Meningioma ([place]); and Hypokalemia on her problem list.. Her primarily concern today is the Back Pain; Hip Pain; and Knee Pain   The patient comes into the clinics today for the first time for a chronic pain management evaluation. She describes the primary area of pain as that of the lower back with the right side being worst on the left. Following this there is the hip pain which is also bilateral but with the right also being greater than the left. The third area of pain is that of the lower extremities with the left being worst on the right. In terms of the left lower extremity pain pattern, this is identical to the one on the right, just a little worse. This pain is described to go down to the level of the knee through the lateral aspect of the leg. The patient also has a history significant for trochanteric bursitis. The next worst pain is that of the knees with the left being worst on the right. In both instances the knee pain is through the front of the knee and there is unavailable MRI indicating extensive chondromalacia of the patellofemoral compartment. Next is the neck pain which is described to be worse in the posterior aspect and going down to the upper back but not radiating into any of the 2 sides. The patient occasionally will complain of headaches described to be at the very top of the head and what appears to be the distribution of the greater occipital nerve. However, the patient indicates not having any pains in the occipital region. Finally, the last area of pain is that of her heels where she describes having spurs.  Reported Pain Score: 7   Reported level is inconsistent with clinical obrservations.  Pain Type: Chronic pain Pain Location: Back Pain Orientation: Lower Pain Descriptors / Indicators: Sharp, Throbbing Pain Frequency: Constant  Onset and Duration: Gradual and Present longer than 3 months Cause of pain: Unknown Severity: Getting worse, NAS-11 at its worse: 10/10, NAS-11 at its best: 4/10, NAS-11 now: 7/10 and NAS-11 on the average: 8/10 Timing: Morning, Noon, Afternoon, Evening, Night, Not influenced by the time of the day, During activity or exercise and After activity or exercise Aggravating Factors: Bending, Climbing, Kneeling, Lifiting, Motion, Prolonged sitting, Prolonged standing, Squatting, Stooping , Twisting, Walking, Walking uphill, Walking downhill and Working Alleviating Factors: Lying down, Medications, Resting, Sitting and TENS Associated Problems: Day-time cramps, Depression, Dizziness, Fatigue, Inability to concentrate, Inability to control bladder (urine), Swelling, Tingling, Weakness, Pain that wakes patient up and Pain that does not allow patient to sleep Quality of Pain: Aching, Agonizing, Annoying, Constant, Disabling, Exhausting, Fearful, Heavy, Horrible, Pressure-like, Pulsating, Sharp, Shooting, Throbbing and Uncomfortable Previous Examinations or Tests: Ct-Myelogram, MRI scan, X-rays, Nerve conduction test, Neurological evaluation, Neurosurgical evaluation, Orthoperdic evaluation and Psychiatric evaluation Previous Treatments: Epidural steroid injections, Narcotic medications, Physical Therapy and TENS  Historic Controlled Substance Pharmacotherapy Review  Previously Prescribed Opioids:  Analgesic: Currently the patient is not taking any opioids. However, she indicates having taken Percocet 10/325 one tablet by mouth 3 times a day to 4 times a day when necessary pain. Medications used in the past have included: Oxycodone 5 mg by mouth twice a day (02/19/2014); morphine ER 15 mg by mouth twice a  day (04/15/2014); OxyContin 10 by mouth twice a day (04/29/2014) MME/day: 0 mg/day Pharmacokinetics: N/A Pharmacodynamics: N/A Historical Background Evaluation:  PDMP: Five (5) year initial data search conducted. Historical Hospital-associated UDS Results:  No results found for: THCU, COCAINSCRNUR, PCPSCRNUR, MDMA, AMPHETMU, METHADONE, ETOH UDS Results: No UDS available, at this time UDS Interpretation: No UDS available, at this time Medication Assessment Form: Not applicable. Initial evaluation. The patient has not received any medications from our practice Treatment compliance: Not applicable. Initial evaluation Risk Assessment: Aberrant Behavior: None observed today  Opioid Fatal Overdose Risk Factors: None detected today Substance Use Disorder (SUD) Risk Level: Pending results of Medical Psychology Evaluation for SUD Opioid Risk Tool (ORT) Score: Total Score: 4 Moderate Risk for SUD (Score between 4-7) Depression Scale Score: PHQ-2: PHQ-2 Total Score: 1 15.4% Probability of major depressive disorder (1) PHQ-9: PHQ-9 Total Score: 1 No depression (0-4)  Pharmacologic Plan: Pending ordered tests and/or consults  Neuromodulation Therapy Review  Type: No neuromodulatory devices implanted Side-effects or Adverse reactions: No device reported Effectiveness: No device reported   Interventional pain management techniques: Left trochanteric bursa injection (07/09/2013) 3 days of pain relief. (Dr. Sharlet Salina)  Allergies  Ms. Dinga is allergic to amitriptyline; morphine and related; naproxen; and prednisone.  Meds  The patient has a current medication list which includes the following prescription(s): albuterol, alprazolam, bupropion, cetirizine, cyclobenzaprine, escitalopram, fluticasone-salmeterol, furosemide, gabapentin, losartan-hydrochlorothiazide, lubiprostone, omeprazole, pregabalin, solifenacin, tiotropium, and potassium chloride sa. Requested Prescriptions    No prescriptions  requested or ordered in this encounter    ROS  Cardiovascular History: Hypertension Pulmonary or Respiratory History: Asthma, Emphysema, Snoring , Bronchitis and Sleep apnea Neurological History: Incontinence:  Urinary Psychological-Psychiatric History: Anxiety Gastrointestinal History: Reflux or heatburn Genitourinary History: Negative for nephrolithiasis, hematuria, renal failure or chronic kidney disease Hematological History: Negative for anticoagulant therapy, anemia, bruising or bleeding easily, hemophilia, sickle cell disease or trait, thrombocytopenia or coagulupathies Endocrine History: Negative for diabetes or thyroid disease Rheumatologic History: Fibromyalgia Musculoskeletal History:  Negative for myasthenia gravis, muscular dystrophy, multiple sclerosis or malignant hyperthermia Work History: Legally disabled  [place]  Medical:  Ms. Holness  has a past medical history of Hypertension; A-fib ([place]); Hyperlipidemia; Asthma; Ruptured appendix; COPD (chronic obstructive pulmonary disease) ([place]); Parkinson's disease (tremor, stiffness, slow motion, unstable posture) ([place]); Brain tumor ([place]); Cancer ([place]); Bursitis; DJD (degenerative joint disease) of hip; Chronic back pain; Fibromyalgia; Apnea, sleep; CHF (congestive heart failure) ([place]); Arthritis of knee, degenerative (08/16/2013); Back pain at L4-L5 level (12/18/2014); DDD (degenerative disc disease), lumbar (12/18/2014); Degenerative arthritis of lumbar spine (08/20/2013); Derangement of knee (09/28/2013); Hip bursitis (12/18/2014); and Spondylosis of lumbosacral region without myelopathy or radiculopathy (12/18/2014). Family: family history includes Heart disease in her father and mother; Heart failure in her father; Hyperlipidemia in her father; Hypertension in her father. Surgical:  has past surgical history that includes Appendectomy; Carpal tunnel release; and Finger surgery. Tobacco:  reports that she quit smoking about 2 years ago. Her  smoking use included Cigarettes. She smoked 0.00 packs per day for 35 years. She has never used smokeless tobacco. Alcohol:  reports that she drinks about 0.6 oz of alcohol per week. Drug:  reports that she does not use illicit drugs.  Physical Exam  Vitals:  Today's Vitals   06/09/15 1303 06/09/15 1305  BP:  116/83  Pulse: 89   Temp: 99 F (37.2 C)   Resp: 18   Height: 5\' 3"  (1.6 m)   Weight: 240 lb (108.863 kg)   SpO2: 97%   PainSc: 7  7   PainLoc: Back     Calculated BMI: Body mass index is 42.52 kg/(m^2). Extreme obesity (Class III) (>40 kg/m2) - 254% higher incidence of chronic pain  General appearance: alert, cooperative, appears stated age, distracted, fatigued, mild distress and morbidly obese Eyes: PERLA Respiratory: No evidence respiratory distress, no audible rales or ronchi and no use of accessory muscles of respiration  Cervical Spine Inspection: Normal anatomy Alignment: Symetrical ROM: Decreased  Upper Extremities Inspection: No gross anomalies detected ROM: Adequate Sensory: Normal Motor: Grossly normal  Thoracic Spine Inspection: No gross anomalies detected Alignment: Symetrical ROM: Adequate Palpation: WNL  Lumbar Spine Inspection: No gross anomalies detected Alignment: Symetrical ROM: Decreased Palpation: Tender Provocative Tests: Lumbar Hyperextension and rotation test: Positive bilaterally Patrick's Maneuver: deferred Gait: Antalgic (limping)  Lower Extremities Inspection: No gross anomalies detected ROM: Adequate Sensory: Normal Motor: Grossly normal  Toe walk (S1): WNL  Heal walk (L5): WNL  Assessment  Primary Diagnosis & Pertinent Problem List: The primary encounter diagnosis was Chronic pain. Diagnoses of Long term current use of opiate analgesic, Lumbar spondylosis, unspecified spinal osteoarthritis, Chronic pain of lower extremity, unspecified laterality, Chronic low back pain, Chronic hip pain, unspecified laterality, Primary  osteoarthritis of both hips, Chronic knee pain (Bilateral) (L>R), Chronic neck pain (posterior and central), Primary osteoarthritis of both knees, Obesity, Class III, BMI 40-49.9 (morbid obesity) ([place]), Abnormal MRI, lumbar spine (06/13/2013), Lumbar facet arthropathy, Lumbar facet syndrome, Lumbar central spinal stenosis (L4-5), Lumbar foraminal stenosis (Bilateral) (L5-S1), Heel spur, unspecified laterality, Abnormal MRI, knee (11/16/2013) (Left), Chondromalacia patellae of knee (Left), Left frontal Meningioma ([place]), and Hypokalemia were also pertinent to this visit.  Visit Diagnosis: 1. Chronic pain   2. Long term current use of opiate analgesic   3. Lumbar spondylosis, unspecified spinal osteoarthritis   4. Chronic pain of lower extremity, unspecified laterality   5. Chronic low back pain   6. Chronic hip pain, unspecified laterality   7. Primary osteoarthritis of both  hips   8. Chronic knee pain (Bilateral) (L>R)   9. Chronic neck pain (posterior and central)   10. Primary osteoarthritis of both knees   11. Obesity, Class III, BMI 40-49.9 (morbid obesity) ([place])   12. Abnormal MRI, lumbar spine (06/13/2013)   13. Lumbar facet arthropathy   14. Lumbar facet syndrome   15. Lumbar central spinal stenosis (L4-5)   16. Lumbar foraminal stenosis (Bilateral) (L5-S1)   17. Heel spur, unspecified laterality   18. Abnormal MRI, knee (11/16/2013) (Left)   19. Chondromalacia patellae of knee (Left)   20. Left frontal Meningioma ([place])   21. Hypokalemia     Assessment: No problem-specific assessment & plan notes found for this encounter.   Plan of Care  Note: As per protocol, today's visit has been an evaluation only. We have not taken over the patient's controlled substance management.  Pharmacotherapy (Medications Ordered): No orders of the defined types were placed in this encounter.    Lab-work & Procedure Ordered: Orders Placed This Encounter  Procedures  . MR Lumbar Spine Wo  Contrast    Standing Status: Future     Number of Occurrences:      Standing Expiration Date: 06/08/2016    Scheduling Instructions:     Please provide canal diameter in millimeters when describing any spinal stenosis.    Order Specific Question:  Reason for Exam (SYMPTOM  OR DIAGNOSIS REQUIRED)    Answer:  Lumbar radiculopathy/radiculitis    Order Specific Question:  Preferred imaging location?    Answer:  [facility]    Order Specific Question:  Does the patient have a pacemaker or implanted devices?    Answer:  No    Order Specific Question:  What is the patient's sedation requirement?    Answer:  No Sedation    Order Specific Question:  Call Results- Best Contact Number?    Answer:  [phone] (Pain Clinic facility) (Dr. Dossie Arbour)  . ToxASSURE Select 13 (MW), Urine    Volume: 30 ml(s). Minimum 3 ml of urine is needed. Document temperature of fresh sample. Indications: Long term (current) use of opiate analgesic (Z79.891)  . C-reactive protein    Standing Status: Future     Number of Occurrences:      Standing Expiration Date: 07/09/2015  . Magnesium    Standing Status: Future     Number of Occurrences:      Standing Expiration Date: 07/09/2015  . Sedimentation rate    Standing Status: Future     Number of Occurrences:      Standing Expiration Date: 07/09/2015  . Vitamin B12    Standing Status: Future     Number of Occurrences:      Standing Expiration Date: 07/09/2015  . Vitamin D 1,25 dihydroxy    Standing Status: Future     Number of Occurrences:      Standing Expiration Date: 07/09/2015  . Ambulatory referral to Psychology    Referral Priority:  Routine    Referral Type:  Psychiatric    Referral Reason:  Specialty Services Required    Referred to Provider:  Beckey Rutter, PHD    Requested Specialty:  Psychology    Number of Visits Requested:  1    Imaging Ordered: AMB REFERRAL TO PSYCHOLOGY MR LUMBAR SPINE WO CONTRAST  Interventional  Therapies: Scheduled: None at this time. PRN Procedures:  1. For the low back pain, bilateral diagnostic lumbar facet block under fluoroscopic guidance and IV sedation.  2. Before the  hip pain, bilateral intra-articular hip blocks under fluoroscopic guidance and IV sedation.  3. For the knee pain, bilateral intra-articular knee injections under fluoroscopic guidance, no sedation.  4. For low back pain and leg pain, left lumbar epidural steroid injection under fluoroscopic guidance and IV sedation.    Referral(s) or Consult(s): Medical psychology consult for substance use disorder evaluation.  Medications administered during this visit: Ms. Bowlby had no medications administered during this visit.  Prescriptions ordered during this visit: New Prescriptions   No medications on file    Future Appointments Date Time Provider [place]  06/24/2015 2:30 PM Trula Slade, DPM [facility]  07/02/2015 11:00 AM Diego Sarita Haver, MD [facility] None    Primary Care Physician: Lorelee Market, MD Location: [facility] Note by: Kathlen [place]. Dossie Arbour, M.D, DABA, DABAPM, DABPM, DABIPP, FIPP

## 2015-06-09 NOTE — Patient Instructions (Signed)
We have seen you for your abdominal pain today.  We have ordered a Ultrasound of your Abdomen to see if there is a source for your pain that we can identify. Central scheduling will be calling you to schedule this. If you would like to call and schedule this, their number is [phone].Please have this done before your next appointment.  We have placed orders for you to have labs and a urine collected, please have these drawn prior to leaving our building today. There is a draw station right down stairs on the 1st floor.  We will call you, Molly Rogers, to give you the lab results as you have asked [place] to do. If you have any questions you have indicated you will let Molly Rogers know and I may speak with her.

## 2015-06-09 NOTE — Telephone Encounter (Signed)
Received lab results that Potassium is 2.9. All other labs and urine reviewed and are normal.   Spoke with Dr. Dahlia Byes regarding this, he has ordered K-Dur 48meq BID for 30 days with no refills. Orders placed to preferred pharmacy. I will notify Dr. Clayborn Bigness of this results.   Will call patient with Urine Culture results in 3 days when this results.  All of the above information was given to [name], [name] at this time.

## 2015-06-09 NOTE — Progress Notes (Signed)
Molly Rogers is a 55 year old female had a laparoscopic appendectomy 3 years ago by Dr. Pat Patrick for complicated ruptured appendicitis. He is in the hospital for a prolonged time with IV antibiotics and respiratory failure. Does have a complex medical history including COPD, Parkinson disease, A. fib and chronic pain. She now has come for abdominal pain. Last time she was seen by her practice was on September 2016 by Dr. Pat Patrick that point the patient had recurrent abdominal pain with no clear etiology. She reports now that over the last couple months the pain is in the right upper quadrant, intermittent, moderate in intensity without specific alleviating or aggravating factors. She is able to tolerate a regular meal and she does have bowel movements with some constipation.  ROS: Otherwise negative other than stated in the history of present illness  PE : NAD, flat  Affect, she is accompanied by her friend who is the power of attorney Chest: CTA, NSR, s1,s2 Abd: soft, minimal RUQ tenderness, no peritonitis, no hernias.    A/P Abd pain, likely multifactorial. At a lengthy discussion with the patient and the power of attorney about the possible etiologies. They are concerned about the possibility of being related to her appendix. I did explain that after 3 years it is extremely unlikely that a complication may arise. She is afebrile her vital signs are stable and her abdominal exam is pretty much benign other than very mild tenderness to palpation in the right upper quadrant. Discussed with them about doing 40 workup. I do not think a CT scan of the abdomen and pelvis is indicated at this time. However they do want me to check her gallbladder as a cause for her symptoms. We will go ahead and order an upper quadrant ultrasound as well as some labs including a CBC and CMP. Currently there seems to have no acute surgical issues at this time. Extensive counseling provided with approximately 50 minutes of time spent is  this encounter including face to face counseling.

## 2015-06-09 NOTE — Addendum Note (Signed)
Addended by: Phillips Odor on: 06/09/2015 03:41 PM   Modules accepted: Orders

## 2015-06-09 NOTE — Progress Notes (Signed)
Safety precautions to be maintained throughout the outpatient stay will include: orient to surroundings, keep bed in low position, maintain call bell within reach at all times, provide assistance with transfer out of bed and ambulation.  

## 2015-06-09 NOTE — Telephone Encounter (Signed)
Call made to [facility] at this time to make the physician aware of this lab results. Spoke with Dr. Clayborn Bigness whom informs me that patient is scheduled for a visit on 3/27 and this will be addressed.

## 2015-06-10 ENCOUNTER — Ambulatory Visit: Payer: Medicaid Other | Admitting: Surgery

## 2015-06-11 LAB — URINE CULTURE

## 2015-06-12 ENCOUNTER — Telehealth: Payer: Self-pay | Admitting: Surgery

## 2015-06-12 NOTE — Telephone Encounter (Signed)
Patient called and was wondering if her results are in yet. She said they should be and why haven't they been sent to Dr. Clayborn Bigness? She would like to talk to [employer]

## 2015-06-13 ENCOUNTER — Ambulatory Visit
Admission: RE | Admit: 2015-06-13 | Discharge: 2015-06-13 | Disposition: A | Discharge status: Home / Self Care | Payer: Medicaid Other | Source: Ambulatory Visit | Attending: Surgery | Admitting: Surgery

## 2015-06-13 DIAGNOSIS — R1084 Generalized abdominal pain: Secondary | ICD-10-CM | POA: Diagnosis not present

## 2015-06-13 LAB — TOXASSURE SELECT 13 (MW), URINE: PDF: 0

## 2015-06-13 NOTE — Telephone Encounter (Signed)
Spoke with patient at this time. All results reviewed over thw phone. Explained that at her follow-up appointment, Dr. Dahlia Byes would decide if anything further is needed prior to sending her back to her PCP, but, at this time; their is nothing identifying a source for her pain. Records faxed over to Dr. Clayborn Bigness per patient request.

## 2015-06-13 NOTE — Telephone Encounter (Signed)
Ultrasound was just done at 10:15am today. I do not have results back yet. As soon as results are in, I will call patient and send notes to Dr. Humphrey Rolls.

## 2015-06-16 ENCOUNTER — Telehealth: Payer: Self-pay

## 2015-06-16 NOTE — Telephone Encounter (Signed)
Dr. Dahlia Byes wanted me to call patient to let her know that her ultrasound was within normal limits and to continue as planned.

## 2015-06-16 NOTE — Telephone Encounter (Signed)
Patient's friend called and has concerns that Dr. Clayborn Bigness wants to take her off of her Potassium. I explained that we have called in enough potassium for 1 month and have notified Dr. Humphrey Rolls of this because she is the patient's PCP. Informed Katharine Look that Dr. Humphrey Rolls will now be managing Potassium. She is also stating that she is wondering if patient has an adrenal gland problem or if she is diabetic. I explained that this would also be things she would have to speak with the PCP about as we do not manage these problems. She verbalizes understanding of this.

## 2015-06-17 ENCOUNTER — Telehealth: Payer: Self-pay | Admitting: Surgery

## 2015-06-17 NOTE — Telephone Encounter (Signed)
Returned phone call to patient at this time. No answer. Unable to leave voicemail as the phone just continued to ring.

## 2015-06-17 NOTE — Telephone Encounter (Signed)
Molly Rogers called back in at this time. I had a lengthy conversation with her in regards to potassium and ultimately told her that she would have to set up another appointment with Dr. Clayborn Bigness to continue potassium if needed or adjust medications that she is currently taking.

## 2015-06-17 NOTE — Telephone Encounter (Signed)
Molly Rogers has called and would like to ask some questions about patients Ultrasound and Potassium level (general questions). She has also stated that patient is not on Lasix at this time through [place].

## 2015-06-24 ENCOUNTER — Ambulatory Visit: Payer: Medicaid Other | Admitting: Podiatry

## 2015-07-02 ENCOUNTER — Ambulatory Visit: Payer: Self-pay | Admitting: Surgery

## 2015-07-02 ENCOUNTER — Ambulatory Visit: Payer: Medicaid Other | Admitting: Pain Medicine

## 2015-07-02 ENCOUNTER — Ambulatory Visit: Payer: Medicaid Other

## 2015-07-03 ENCOUNTER — Ambulatory Visit
Admission: RE | Admit: 2015-07-03 | Discharge: 2015-07-03 | Disposition: A | Discharge status: Home / Self Care | Payer: Medicaid Other | Source: Ambulatory Visit | Attending: Pain Medicine | Admitting: Pain Medicine

## 2015-07-03 DIAGNOSIS — G8929 Other chronic pain: Secondary | ICD-10-CM | POA: Diagnosis present

## 2015-07-03 DIAGNOSIS — M545 Low back pain, unspecified: Secondary | ICD-10-CM

## 2015-07-03 DIAGNOSIS — M5126 Other intervertebral disc displacement, lumbar region: Secondary | ICD-10-CM | POA: Insufficient documentation

## 2015-07-03 DIAGNOSIS — M79606 Pain in leg, unspecified: Secondary | ICD-10-CM

## 2015-07-03 DIAGNOSIS — M4806 Spinal stenosis, lumbar region: Secondary | ICD-10-CM | POA: Diagnosis not present

## 2015-07-09 ENCOUNTER — Ambulatory Visit: Payer: Medicaid Other | Admitting: Pain Medicine

## 2015-07-11 ENCOUNTER — Encounter: Payer: Self-pay | Admitting: Pain Medicine

## 2015-07-11 ENCOUNTER — Ambulatory Visit: Payer: Medicaid Other | Attending: Pain Medicine | Admitting: Pain Medicine

## 2015-07-11 VITALS — BP 109/71 | HR 84 | Temp 97.7°F | Resp 16 | Ht 63.0 in | Wt 240.0 lb

## 2015-07-11 DIAGNOSIS — F1190 Opioid use, unspecified, uncomplicated: Secondary | ICD-10-CM | POA: Insufficient documentation

## 2015-07-11 DIAGNOSIS — F419 Anxiety disorder, unspecified: Secondary | ICD-10-CM | POA: Insufficient documentation

## 2015-07-11 DIAGNOSIS — Z79891 Long term (current) use of opiate analgesic: Secondary | ICD-10-CM | POA: Diagnosis not present

## 2015-07-11 DIAGNOSIS — Z5181 Encounter for therapeutic drug level monitoring: Secondary | ICD-10-CM | POA: Diagnosis not present

## 2015-07-11 DIAGNOSIS — E7801 Familial hypercholesterolemia: Secondary | ICD-10-CM | POA: Diagnosis not present

## 2015-07-11 DIAGNOSIS — M160 Bilateral primary osteoarthritis of hip: Secondary | ICD-10-CM | POA: Insufficient documentation

## 2015-07-11 DIAGNOSIS — M545 Low back pain, unspecified: Secondary | ICD-10-CM

## 2015-07-11 DIAGNOSIS — G250 Essential tremor: Secondary | ICD-10-CM | POA: Diagnosis not present

## 2015-07-11 DIAGNOSIS — R0609 Other forms of dyspnea: Secondary | ICD-10-CM

## 2015-07-11 DIAGNOSIS — G4733 Obstructive sleep apnea (adult) (pediatric): Secondary | ICD-10-CM | POA: Diagnosis not present

## 2015-07-11 DIAGNOSIS — J849 Interstitial pulmonary disease, unspecified: Secondary | ICD-10-CM | POA: Diagnosis not present

## 2015-07-11 DIAGNOSIS — G8929 Other chronic pain: Secondary | ICD-10-CM | POA: Insufficient documentation

## 2015-07-11 DIAGNOSIS — Z87891 Personal history of nicotine dependence: Secondary | ICD-10-CM | POA: Diagnosis not present

## 2015-07-11 DIAGNOSIS — R0681 Apnea, not elsewhere classified: Secondary | ICD-10-CM

## 2015-07-11 DIAGNOSIS — M4806 Spinal stenosis, lumbar region: Secondary | ICD-10-CM | POA: Diagnosis not present

## 2015-07-11 DIAGNOSIS — I509 Heart failure, unspecified: Secondary | ICD-10-CM | POA: Diagnosis not present

## 2015-07-11 DIAGNOSIS — M25551 Pain in right hip: Secondary | ICD-10-CM | POA: Insufficient documentation

## 2015-07-11 DIAGNOSIS — M797 Fibromyalgia: Secondary | ICD-10-CM | POA: Insufficient documentation

## 2015-07-11 DIAGNOSIS — R51 Headache: Secondary | ICD-10-CM | POA: Insufficient documentation

## 2015-07-11 DIAGNOSIS — D329 Benign neoplasm of meninges, unspecified: Secondary | ICD-10-CM | POA: Diagnosis not present

## 2015-07-11 DIAGNOSIS — R109 Unspecified abdominal pain: Secondary | ICD-10-CM | POA: Diagnosis present

## 2015-07-11 DIAGNOSIS — R0602 Shortness of breath: Secondary | ICD-10-CM

## 2015-07-11 DIAGNOSIS — M170 Bilateral primary osteoarthritis of knee: Secondary | ICD-10-CM | POA: Diagnosis not present

## 2015-07-11 DIAGNOSIS — I340 Nonrheumatic mitral (valve) insufficiency: Secondary | ICD-10-CM | POA: Insufficient documentation

## 2015-07-11 DIAGNOSIS — R4701 Aphasia: Secondary | ICD-10-CM | POA: Insufficient documentation

## 2015-07-11 DIAGNOSIS — M7060 Trochanteric bursitis, unspecified hip: Secondary | ICD-10-CM | POA: Insufficient documentation

## 2015-07-11 DIAGNOSIS — M47816 Spondylosis without myelopathy or radiculopathy, lumbar region: Secondary | ICD-10-CM | POA: Diagnosis not present

## 2015-07-11 DIAGNOSIS — M25559 Pain in unspecified hip: Secondary | ICD-10-CM | POA: Diagnosis present

## 2015-07-11 DIAGNOSIS — I10 Essential (primary) hypertension: Secondary | ICD-10-CM | POA: Insufficient documentation

## 2015-07-11 DIAGNOSIS — N939 Abnormal uterine and vaginal bleeding, unspecified: Secondary | ICD-10-CM | POA: Diagnosis not present

## 2015-07-11 DIAGNOSIS — G4730 Sleep apnea, unspecified: Secondary | ICD-10-CM | POA: Insufficient documentation

## 2015-07-11 DIAGNOSIS — N393 Stress incontinence (female) (male): Secondary | ICD-10-CM | POA: Insufficient documentation

## 2015-07-11 DIAGNOSIS — M25562 Pain in left knee: Secondary | ICD-10-CM | POA: Insufficient documentation

## 2015-07-11 DIAGNOSIS — M25552 Pain in left hip: Secondary | ICD-10-CM | POA: Insufficient documentation

## 2015-07-11 DIAGNOSIS — L309 Dermatitis, unspecified: Secondary | ICD-10-CM | POA: Diagnosis not present

## 2015-07-11 DIAGNOSIS — I4892 Unspecified atrial flutter: Secondary | ICD-10-CM | POA: Diagnosis not present

## 2015-07-11 DIAGNOSIS — J449 Chronic obstructive pulmonary disease, unspecified: Secondary | ICD-10-CM | POA: Diagnosis not present

## 2015-07-11 DIAGNOSIS — M25561 Pain in right knee: Secondary | ICD-10-CM | POA: Diagnosis not present

## 2015-07-11 DIAGNOSIS — Z79899 Other long term (current) drug therapy: Secondary | ICD-10-CM

## 2015-07-11 DIAGNOSIS — I4891 Unspecified atrial fibrillation: Secondary | ICD-10-CM | POA: Diagnosis not present

## 2015-07-11 DIAGNOSIS — M549 Dorsalgia, unspecified: Secondary | ICD-10-CM | POA: Diagnosis present

## 2015-07-11 DIAGNOSIS — Z6841 Body Mass Index (BMI) 40.0 and over, adult: Secondary | ICD-10-CM | POA: Diagnosis not present

## 2015-07-11 DIAGNOSIS — M2242 Chondromalacia patellae, left knee: Secondary | ICD-10-CM | POA: Diagnosis not present

## 2015-07-11 DIAGNOSIS — M533 Sacrococcygeal disorders, not elsewhere classified: Secondary | ICD-10-CM | POA: Insufficient documentation

## 2015-07-11 MED ORDER — OXYCODONE HCL 10 MG PO TABS: 10.0000 mg | ORAL_TABLET | Freq: Three times a day (TID) | ORAL | Status: DC | PRN

## 2015-07-11 NOTE — Patient Instructions (Signed)

## 2015-07-11 NOTE — Progress Notes (Signed)
Patient is here for second appt as a new patient evaluation.  Patient states she had a MRI on 07/03/15 Safety precautions to be maintained throughout the outpatient stay will include: orient to surroundings, keep bed in low position, maintain call bell within reach at all times, provide assistance with transfer out of bed and ambulation.

## 2015-07-11 NOTE — Progress Notes (Signed)
Patient's Name: Molly Rogers  Patient type: Established  MRN: [id number]  Service setting: Ambulatory outpatient  DOB: [birth year]  Location: [facility]  DOS: 07/11/2015  Primary Care Physician: Molly Rogers., PA  Note by: Kathlen Brunswick. Dossie Arbour, M.D, DABA, [place], DABPM, DABIPP, [place]  Referring Physician: Lorelee Market, MD  Specialty: Board-Certified Interventional Pain Management     Primary Reason(s) for Visit: Encounter for evaluation in starting prescription medication management (Level of risk: moderate) CC: Back Pain; Hip Pain; Knee Pain; and Abdominal Pain   HPI  [name] is a 55 y.o. year old, female patient, who returns today as an established patient. She has SOB (shortness of breath); Atrial flutter ([place]); Abdominal discomfort; Essential hypertension; Anxiety; Acquired aphasia; Appendicular ataxia; CCF (congestive cardiac failure) ([place]); Chronic headache; CAFL (chronic airflow limitation) ([place]); Dermatitis, eczematoid; Benign essential tremor; BP (high blood pressure); ILD (interstitial lung disease) ([place]); Neuritis or radiculitis due to rupture of lumbar intervertebral disc; Benign neoplasm of meninges (HCC); MI (mitral incompetence); Neurosis, posttraumatic; Apnea, sleep; Transient cerebral ischemia due to atrial fibrillation ([place]); Has a tremor; Chronic trochanteric bursitis; Fibromyalgia; Atrial fibrillation ([place]); Abnormal uterine bleeding; Congestive heart failure ([place]); Chronic obstructive pulmonary disease ([place]); Interstitial lung disease ([place]); Neoplasm of meninges ([place]); Spasm; Female climacteric state; Sacrococcygeal disorders, not elsewhere classified; Female genuine stress incontinence; Breathlessness on exertion; Paroxysmal atrial fibrillation ([place]); Obstructive apnea; Combined fat and carbohydrate induced hyperlipemia; Benign essential HTN; Chronic pain; Long term current use of opiate analgesic; Lumbar spondylosis; Chronic lower  extremity pain (Location of Tertiary source of pain) (Bilateral) (L>R); Chronic low back pain (Location of Primary Source of Pain) (Bilateral) (Midline) (R>L); Chronic hip pain (Location of Secondary source of pain) (Bilateral) (R>L); Osteoarthritis of hips (Location of Secondary source of pain) (Bilateral) (R>L); Chronic knee pain (Bilateral) (L>R); Chronic neck pain (posterior and central); 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 Her primarily concern today is the Back Pain; Hip Pain; Knee Pain; and Abdominal Pain   Pain Assessment: Self-Reported Pain Score: 6  Reported level is compatible with observation Pain Type: Chronic pain Pain Location: Back (knee, hip, abdominal) Pain Orientation: Lower (bilateral hips, L knee, abdominal, fibroids) Pain Descriptors / Indicators: Constant, Aching, Sharp Pain Frequency: Constant  The patient returns to the clinics today for her second appointment after initially having been seen on 06/09/2015. Today orientation was to get the patient back on her old medication regimen however, during the appointment her home health care provider, who normally accompanies her to all of her visits, indicated that the patient's breathing was so bad that she had been told that she could not tolerate any sedations for any type of procedures. According to her the patient had "coded" during a procedure surgery. This was very discussed concerning to me since clearly if she cannot  tolerate a small amount of sedation under direct supervision and fully monitored, then it is clearly not a good idea to keep this patient on oral opioids at home.  Today we reviewed the results of the patient's MRI and we have provided them with copies of the official report. In reviewing  the report, it is clear to me that the patient has significant degenerative changes of the lumbar spine with facet disease that clearly explains a lot of her low back pain. My plan was to bring the patient back for a diagnostic bilateral lumbar facet block under fluoroscopic guidance and IV sedation, but he was at this point that her health care provider indicated that she was told that she could have no sedation. She also indicated that she previously had tried some injections and they did not work. I do understand the protective concerns of her health care provider/advisor, but this point it is my opinion that he would be a lot more dangerous to put this patient on unsupervised and on monitored opioids at home then to treat her with interventional techniques, while fully monitored and full medical supervision.  At this point, we have decided to send the patient for clearance to be able to proceed with these interventional techniques. From where I'm standing, if the patient is unable to tolerate the interventional techniques, then I wouldn't feel comfortable prescribing the oral opioids for her to take at home.  Date of Last Visit: 06/09/15 Service Provided on Last Visit: Med Refill  Controlled Substance Pharmacotherapy Assessment & REMS (Risk Evaluation and Mitigation Strategy)  Analgesic: Oxycodone IR 10 mg every 8 hours (30 mg/day) Pill Count: The patient did not bring her bottles or any of her pills. MME/day: 45 mg/day.  Pharmacokinetics: Onset of action (Liberation/Absorption): Within expected pharmacological parameters Time to Peak effect (Distribution): Timing and results are as within normal expected  parameters Duration of action (Metabolism/Excretion): Within normal limits for medication Pharmacodynamics: Analgesic Effect: More than 50% Activity Facilitation: Medication(s) allow patient to sit, stand, walk, and do the basic ADLs Perceived Effectiveness: Described as relatively effective, allowing for increase in activities of daily living (ADL) Side-effects or Adverse reactions: None reported Monitoring: [place] PMP: Online review of the past 17-month period conducted. Compliant with practice rules and regulations UDS Results/interpretation: The patient's last UDS was done on 06/09/2015 and it came back within normal limits with no unexpected results. Medication Assessment Form: Reviewed. Patient indicates being compliant with therapy Treatment compliance: Compliant Risk Assessment: Aberrant Behavior: None observed today Substance Use Disorder (SUD) Risk Level: Moderate-to-high. The patient has a history of significant COPD and poor pulmonary function with obstructive sleep apnea. Risk of opioid abuse or dependence: 0.7-3.0% with doses ? 36 MME/day and 6.1-26% with doses ? 120 MME/day. Opioid Risk Tool (ORT) Score: Total Score: 6 Moderate Risk for SUD (Score between 4-7) Depression Scale Score: PHQ-2: PHQ-2 Total Score: 4 45.5% Probability of major depressive disorder (4) PHQ-9: PHQ-9 Total Score: 13 Moderate depression (10-14)  Pharmacologic Plan: No change in therapy, at this time  Laboratory Chemistry  Inflammation Markers No results found for: ESRSEDRATE, CRP  Renal Function Lab Results  Component Value Date   BUN 16 06/09/2015   CREATININE 0.97 06/09/2015   GFRAA >60 06/09/2015   GFRNONAA >60 06/09/2015    Hepatic Function Lab Results  Component Value Date   AST 26 06/09/2015   ALT 30 06/09/2015   ALBUMIN 4.0 06/09/2015    Electrolytes Lab Results  Component Value Date   NA 134* 06/09/2015   K 2.9* 06/09/2015   CL 99* 06/09/2015   CALCIUM 8.9 06/09/2015   MG  2.0 05/01/2012    Pain Modulating Vitamins No results found for: [place], VD125OH2TOT, [id number], [id number], VITAMINB12  Coagulation Parameters No results found for: INR, LABPROT  Note: I personally reviewed  the above data. Results shared with patient.  Meds  The patient has a current medication list which includes the following prescription(s): albuterol, alprazolam, bupropion, cetirizine, fluticasone-salmeterol, losartan-hydrochlorothiazide, lubiprostone, pravastatin, solifenacin, and tiotropium.  Current Outpatient Prescriptions on File Prior to Visit  Medication Sig  . albuterol (PROVENTIL) (2.5 MG/3ML) 0.083% nebulizer solution Take 2.5 mg by nebulization every 6 (six) hours as needed for wheezing.  Marland Kitchen ALPRAZolam (XANAX) 1 MG tablet Take 1 mg by mouth 3 (three) times daily.  Marland Kitchen buPROPion (WELLBUTRIN XL) 150 MG 24 hr tablet Take 300 mg by mouth daily.   . cetirizine (ZYRTEC) 10 MG tablet Take 10 mg by mouth daily.  . Fluticasone-Salmeterol (ADVAIR DISKUS) 250-50 MCG/DOSE AEPB Inhale 1 puff into the lungs 2 (two) times daily.  Marland Kitchen losartan-hydrochlorothiazide (HYZAAR) 100-25 MG tablet Take 1 tablet by mouth.  . lubiprostone (AMITIZA) 8 MCG capsule Take 1 tablet by mouth 2 (two) times daily.   . solifenacin (VESICARE) 10 MG tablet Take 1 tablet by mouth daily. Reported on 07/11/2015  . tiotropium (SPIRIVA HANDIHALER) 18 MCG inhalation capsule Place 1 tablet into inhaler and inhale 1 day or 1 dose.   No current facility-administered medications on file prior to visit.    ROS  Constitutional: Afebrile, no chills, well hydrated and well nourished Gastrointestinal: No upper or lower GI bleeding, no nausea, no vomiting and no acute GI distress Musculoskeletal: No acute joint swelling or redness, no acute loss of range of motion and no acute onset weakness Neurological: Denies any acute onset apraxia, no episodes of paralysis, no acute loss of coordination, no acute loss of consciousness and  no acute onset aphasia, dysarthria, agnosia, or amnesia  Allergies  Ms. Witkin is allergic to amitriptyline; morphine and related; naproxen; and prednisone.  [place]  Medical:  Ms. Fogal  has a past medical history of Hypertension; A-fib ([place]); Hyperlipidemia; Asthma; Ruptured appendix; COPD (chronic obstructive pulmonary disease) ([place]); Parkinson's disease (tremor, stiffness, slow motion, unstable posture) ([place]); Brain tumor ([place]); Cancer ([place]); Bursitis; DJD (degenerative joint disease) of hip; Chronic back pain; Fibromyalgia; Apnea, sleep; CHF (congestive heart failure) ([place]); Arthritis of knee, degenerative (08/16/2013); Back pain at L4-L5 level (12/18/2014); DDD (degenerative disc disease), lumbar (12/18/2014); Degenerative arthritis of lumbar spine (08/20/2013); Derangement of knee (09/28/2013); Hip bursitis (12/18/2014); and Spondylosis of lumbosacral region without myelopathy or radiculopathy (12/18/2014). Family: family history includes Heart disease in her father and mother; Heart failure in her father; Hyperlipidemia in her father; Hypertension in her father. Surgical:  has past surgical history that includes Appendectomy; Carpal tunnel release; and Finger surgery. Tobacco:  reports that she quit smoking about 2 years ago. Her smoking use included Cigarettes. She smoked 0.00 packs per day for 35 years. She has never used smokeless tobacco. Alcohol:  reports that she drinks about 0.6 oz of alcohol per week. Drug:  reports that she does not use illicit drugs.  Physical Examination  Constitutional Vitals: Blood pressure 109/71, pulse 84, temperature 97.7 F (36.5 C), temperature source Oral, resp. rate 16, height 5\' 3"  (1.6 m), weight 240 lb (108.863 kg), SpO2 97 %. Calculated BMI: Body mass index is 42.52 kg/(m^2). (>40 kg/m2) Extreme obesity (Class III) - 254% higher incidence of chronic pain General appearance: alert, cooperative, distracted, slowed mentation, in no distress, morbidly obese,  well nourished and well hydrated Eyes: PERLA Respiratory: No evidence respiratory distress, no audible rales or ronchi and no use of accessory muscles of respiration Psych: Alert, oriented to person, oriented to place and oriented to time  Cervical Spine Exam  Inspection: Normal anatomy, no anomalies observed Cervical Lordosis: Normal Alignment: Symetrical Functional ROM: Within functional limits (WFL) AROM: WFL Sensory: No sensory anomalies reported or detected  Upper Extremity Exam    Right  Left  Inspection: No gross anomalies detected  Inspection: No gross anomalies detected  Functional ROM: Adequate  Functional ROM: Adequate  AROM: Adequate  AROM: Adequate  Sensory: No sensory anomalies reported or detected  Sensory: No sensory anomalies reported or detected  Motor: Unremarkable  Motor: Unremarkable  Vascular: Normal skin color, temperature, and hair growth. No peripheral edema or cyanosis  Vascular: Normal skin color, temperature, and hair growth. No peripheral edema or cyanosis   Thoracic Spine  Inspection: No gross anomalies detected Alignment: Symetrical Functional ROM: Within functional limits [facility]) AROM: Adequate Palpation: WNL  Lumbar Spine  Inspection: No gross anomalies detected Alignment: Symetrical Functional ROM: Within functional limits [facility]) AROM: Adequate Sensory: No sensory anomalies reported or detected Palpation: Tender to palpation in the lumbar region. Provocative Tests: Lumbar Hyperextension and rotation test: Positive bilaterally for pain from the facet joints. Patrick's Maneuver: deferred  Gait Assessment  Gait: Antalgic (limping)  Lower Extremities    Right  Left  Inspection: No gross anomalies detected  Inspection: No gross anomalies detected  Functional ROM: Within functional limits [facility])  Functional ROM: Within functional limits (WFL)  AROM: Adequate  AROM: Adequate  Sensory: No sensory anomalies reported or detected  Sensory: No sensory  anomalies reported or detected  Motor: Unremarkable  Motor: Unremarkable  Toe walk (S1): WNL  Toe walk (S1): WNL  Heal walk (L5): WNL  Heal walk (L5): WNL   Assessment & Plan  Primary Diagnosis & Pertinent Problem List: The primary encounter diagnosis was Chronic pain. Diagnoses of Opiate use, Long term prescription opiate use, Encounter for therapeutic drug level monitoring, Chronic low back pain (Location of Primary Source of Pain) (Bilateral) (Midline) (R>L), Lumbar facet syndrome, Chronic obstructive pulmonary disease, unspecified COPD type ([place]), Apnea, sleep, Breathlessness on exertion, Obstructive apnea, and SOB (shortness of breath) were also pertinent to this visit.  Visit Diagnosis: 1. Chronic pain   2. Opiate use   3. Long term prescription opiate use   4. Encounter for therapeutic drug level monitoring   5. Chronic low back pain (Location of Primary Source of Pain) (Bilateral) (Midline) (R>L)   6. Lumbar facet syndrome   7. Chronic obstructive pulmonary disease, unspecified COPD type ([place])   8. Apnea, sleep   9. Breathlessness on exertion   10. Obstructive apnea   11. SOB (shortness of breath)     Problems updated and reviewed during this visit: Problem  Opiate use (45 MME/Day)  Interstitial Lung Disease (Hcc)  Dermatitis, Eczematoid    Problem-specific Plan(s): No problem-specific assessment & plan notes found for this encounter.  No new assessment & plan notes have been filed under this hospital service since the last note was generated. Service: Pain Management   Plan of Care   Problem List Items Addressed This Visit      High   Chronic low back pain (Location of Primary Source of Pain) (Bilateral) (Midline) (R>L) (Chronic)   Chronic pain - Primary (Chronic)   Lumbar facet syndrome (Chronic)   Relevant Orders   LUMBAR FACET(MEDIAL BRANCH NERVE BLOCK) MBNB     Medium   Encounter for therapeutic drug level monitoring   Long term prescription opiate use  (Chronic)   Relevant Orders   ToxASSURE Select 13 (MW), Urine   Opiate  use (45 MME/Day) (Chronic)     Unprioritized   Apnea, sleep   Relevant Orders   Ambulatory referral to Pulmonology   Breathlessness on exertion   Relevant Orders   Ambulatory referral to Pulmonology   Chronic obstructive pulmonary disease ([place])   Relevant Orders   Ambulatory referral to Pulmonology   Obstructive apnea   Relevant Orders   Ambulatory referral to Pulmonology   SOB (shortness of breath)   Relevant Orders   Ambulatory referral to Pulmonology       Pharmacotherapy (Medications Ordered): Meds ordered this encounter  Medications  . DISCONTD: Oxycodone HCl 10 MG TABS    Sig: Take 1 tablet (10 mg total) by mouth every 8 (eight) hours as needed.    Dispense:  90 tablet    Refill:  0    Do not place this medication, or any other prescription from our practice, on "Automatic Refill". Patient may have prescription filled one day early if pharmacy is closed on scheduled refill date. Do not fill until: 07/11/15 To last until: 08/10/15    [facility] & Procedure Ordered: Orders Placed This Encounter  Procedures  . LUMBAR FACET(MEDIAL BRANCH NERVE BLOCK) MBNB  . ToxASSURE Select 13 (MW), Urine  . Ambulatory referral to Pulmonology    Imaging Ordered: AMB REFERRAL TO PULMONOLOGY  Interventional Therapies: Scheduled:  None until we can get clearance for her to undergo interventional techniques with mild-to-moderate sedation.    Considering:  Diagnostic bilateral lumbar facet block under fluoroscopic guidance and IV sedation.    none at this point.   Referral(s) or Consult(s): None at this time.  New Prescriptions   No medications on file    Medications administered during this visit: Ms. Maywald had no medications administered during this visit.  No future appointments.  Primary Care Physician: Molly Rogers., PA Location: [facility] Note by: Kathlen Brunswick.  Dossie Arbour, M.D, DABA, DABAPM, DABPM, DABIPP, FIPP  Pain Score Disclaimer: We use the NRS-11 scale. This is a self-reported, subjective measurement of pain severity with only modest accuracy. It is used primarily to identify changes within a particular patient. It must be understood that outpatient pain scales are significantly less accurate that those used for research, where they can be applied under ideal controlled circumstances with minimal exposure to variables. In reality, the score is likely to be a combination of pain intensity and pain affect, where pain affect describes the degree of emotional arousal or changes in action readiness caused by the sensory experience of pain. Factors such as social and work situation, setting, emotional state, anxiety levels, expectation, and prior pain experience may influence pain perception and show large inter-individual differences that may also be affected by time variables.

## 2015-07-15 ENCOUNTER — Telehealth: Payer: Self-pay | Admitting: Pain Medicine

## 2015-07-15 ENCOUNTER — Other Ambulatory Visit: Payer: Self-pay | Admitting: Internal Medicine

## 2015-07-15 NOTE — Telephone Encounter (Signed)
This patient had ambulatory referral orders to pulmonology at last visit.  Were they done?  Please let the patient know.

## 2015-07-15 NOTE — Telephone Encounter (Signed)
Patient was told our office would send request to pulmonologist requesting permission for patient clearance to have procedure, has this been done ? Please call patient and let her know

## 2015-07-23 ENCOUNTER — Telehealth: Payer: Self-pay

## 2015-07-23 NOTE — Telephone Encounter (Signed)
Did this patient get her pulmonology referral done?  No procedures were ordered until she got clearance from pulmonologist.  Please see if referral was done.

## 2015-07-23 NOTE — Telephone Encounter (Signed)
Does pt need to come in for procedure? I scheduled for an eval because she does not know what the doctor told her to do. Neither do I? Please find out so that I can make sure she is coming in for the right thing

## 2015-07-27 NOTE — Progress Notes (Signed)
Quick Note:  The results of this diagnostic imaging were reviewed and found to be: abnormal.  Based on these results, interventional pain management techniques may be of benefit.  In addition, surgical consultation may be of benefit. ______ 

## 2015-07-28 ENCOUNTER — Other Ambulatory Visit: Payer: Self-pay | Admitting: Pain Medicine

## 2015-07-28 ENCOUNTER — Telehealth: Payer: Self-pay | Admitting: Pain Medicine

## 2015-07-28 ENCOUNTER — Telehealth: Payer: Self-pay

## 2015-07-28 DIAGNOSIS — G8929 Other chronic pain: Secondary | ICD-10-CM

## 2015-07-28 NOTE — Telephone Encounter (Signed)
Called patient to let her know that lab work has been reordered for patient and informed that she will need to have this done within the next weeks.

## 2015-07-28 NOTE — Telephone Encounter (Signed)
Does she need blood work done, no orders

## 2015-08-08 ENCOUNTER — Other Ambulatory Visit
Admission: RE | Admit: 2015-08-08 | Discharge: 2015-08-08 | Disposition: A | Discharge status: Home / Self Care | Payer: Medicaid Other | Source: Ambulatory Visit | Attending: Pain Medicine | Admitting: Pain Medicine

## 2015-08-08 DIAGNOSIS — G8929 Other chronic pain: Secondary | ICD-10-CM | POA: Diagnosis present

## 2015-08-08 LAB — COMPREHENSIVE METABOLIC PANEL
ALK PHOS: 67 U/L (ref 38–126)
ALT: 17 U/L (ref 14–54)
ANION GAP: 7 (ref 5–15)
AST: 20 U/L (ref 15–41)
Albumin: 3.9 g/dL (ref 3.5–5.0)
BILIRUBIN TOTAL: 0.4 mg/dL (ref 0.3–1.2)
BUN: 13 mg/dL (ref 6–20)
CALCIUM: 9.1 mg/dL (ref 8.9–10.3)
CO2: 29 mmol/L (ref 22–32)
Chloride: 103 mmol/L (ref 101–111)
Creatinine, Ser: 0.86 mg/dL (ref 0.44–1.00)
Glucose, Bld: 89 mg/dL (ref 65–99)
Potassium: 3.7 mmol/L (ref 3.5–5.1)
Sodium: 139 mmol/L (ref 135–145)
TOTAL PROTEIN: 7.3 g/dL (ref 6.5–8.1)

## 2015-08-08 LAB — MAGNESIUM: MAGNESIUM: 2.1 mg/dL (ref 1.7–2.4)

## 2015-08-08 LAB — VITAMIN B12: VITAMIN B 12: 385 pg/mL (ref 180–914)

## 2015-08-08 LAB — SEDIMENTATION RATE: SED RATE: 14 mm/h (ref 0–30)

## 2015-08-09 LAB — C-REACTIVE PROTEIN: CRP: <0.5 mg/dL (ref <1.0)

## 2015-08-11 ENCOUNTER — Encounter: Payer: Self-pay | Admitting: Pain Medicine

## 2015-08-11 ENCOUNTER — Ambulatory Visit: Payer: Medicaid Other | Attending: Pain Medicine | Admitting: Pain Medicine

## 2015-08-11 VITALS — BP 106/73 | HR 81 | Temp 98.3°F | Resp 16 | Ht 63.0 in | Wt 230.0 lb

## 2015-08-11 DIAGNOSIS — G8929 Other chronic pain: Secondary | ICD-10-CM | POA: Diagnosis not present

## 2015-08-11 DIAGNOSIS — Z5181 Encounter for therapeutic drug level monitoring: Secondary | ICD-10-CM | POA: Diagnosis not present

## 2015-08-11 DIAGNOSIS — I340 Nonrheumatic mitral (valve) insufficiency: Secondary | ICD-10-CM | POA: Diagnosis not present

## 2015-08-11 DIAGNOSIS — E782 Mixed hyperlipidemia: Secondary | ICD-10-CM | POA: Insufficient documentation

## 2015-08-11 DIAGNOSIS — L308 Other specified dermatitis: Secondary | ICD-10-CM | POA: Insufficient documentation

## 2015-08-11 DIAGNOSIS — M7060 Trochanteric bursitis, unspecified hip: Secondary | ICD-10-CM | POA: Diagnosis not present

## 2015-08-11 DIAGNOSIS — Z780 Asymptomatic menopausal state: Secondary | ICD-10-CM | POA: Diagnosis not present

## 2015-08-11 DIAGNOSIS — M25552 Pain in left hip: Secondary | ICD-10-CM | POA: Insufficient documentation

## 2015-08-11 DIAGNOSIS — M94262 Chondromalacia, left knee: Secondary | ICD-10-CM | POA: Insufficient documentation

## 2015-08-11 DIAGNOSIS — E876 Hypokalemia: Secondary | ICD-10-CM | POA: Insufficient documentation

## 2015-08-11 DIAGNOSIS — M25559 Pain in unspecified hip: Secondary | ICD-10-CM | POA: Diagnosis present

## 2015-08-11 DIAGNOSIS — R0602 Shortness of breath: Secondary | ICD-10-CM | POA: Diagnosis not present

## 2015-08-11 DIAGNOSIS — M797 Fibromyalgia: Secondary | ICD-10-CM | POA: Diagnosis not present

## 2015-08-11 DIAGNOSIS — M549 Dorsalgia, unspecified: Secondary | ICD-10-CM | POA: Diagnosis present

## 2015-08-11 DIAGNOSIS — M533 Sacrococcygeal disorders, not elsewhere classified: Secondary | ICD-10-CM | POA: Diagnosis not present

## 2015-08-11 DIAGNOSIS — F419 Anxiety disorder, unspecified: Secondary | ICD-10-CM | POA: Insufficient documentation

## 2015-08-11 DIAGNOSIS — M47816 Spondylosis without myelopathy or radiculopathy, lumbar region: Secondary | ICD-10-CM | POA: Diagnosis not present

## 2015-08-11 DIAGNOSIS — I509 Heart failure, unspecified: Secondary | ICD-10-CM | POA: Insufficient documentation

## 2015-08-11 DIAGNOSIS — I10 Essential (primary) hypertension: Secondary | ICD-10-CM | POA: Diagnosis not present

## 2015-08-11 DIAGNOSIS — I4891 Unspecified atrial fibrillation: Secondary | ICD-10-CM | POA: Insufficient documentation

## 2015-08-11 DIAGNOSIS — Z87891 Personal history of nicotine dependence: Secondary | ICD-10-CM | POA: Diagnosis not present

## 2015-08-11 DIAGNOSIS — G250 Essential tremor: Secondary | ICD-10-CM | POA: Insufficient documentation

## 2015-08-11 DIAGNOSIS — G458 Other transient cerebral ischemic attacks and related syndromes: Secondary | ICD-10-CM | POA: Insufficient documentation

## 2015-08-11 DIAGNOSIS — N938 Other specified abnormal uterine and vaginal bleeding: Secondary | ICD-10-CM | POA: Diagnosis not present

## 2015-08-11 DIAGNOSIS — G20 Parkinson's disease: Secondary | ICD-10-CM | POA: Diagnosis not present

## 2015-08-11 DIAGNOSIS — G4730 Sleep apnea, unspecified: Secondary | ICD-10-CM | POA: Diagnosis not present

## 2015-08-11 DIAGNOSIS — D329 Benign neoplasm of meninges, unspecified: Secondary | ICD-10-CM | POA: Insufficient documentation

## 2015-08-11 DIAGNOSIS — R109 Unspecified abdominal pain: Secondary | ICD-10-CM | POA: Diagnosis not present

## 2015-08-11 DIAGNOSIS — J449 Chronic obstructive pulmonary disease, unspecified: Secondary | ICD-10-CM | POA: Diagnosis not present

## 2015-08-11 DIAGNOSIS — M545 Low back pain: Secondary | ICD-10-CM | POA: Diagnosis not present

## 2015-08-11 DIAGNOSIS — M4806 Spinal stenosis, lumbar region: Secondary | ICD-10-CM | POA: Insufficient documentation

## 2015-08-11 DIAGNOSIS — R51 Headache: Secondary | ICD-10-CM | POA: Insufficient documentation

## 2015-08-11 DIAGNOSIS — M25569 Pain in unspecified knee: Secondary | ICD-10-CM | POA: Diagnosis present

## 2015-08-11 DIAGNOSIS — R4701 Aphasia: Secondary | ICD-10-CM | POA: Insufficient documentation

## 2015-08-11 DIAGNOSIS — Z6841 Body Mass Index (BMI) 40.0 and over, adult: Secondary | ICD-10-CM | POA: Diagnosis not present

## 2015-08-11 DIAGNOSIS — J849 Interstitial pulmonary disease, unspecified: Secondary | ICD-10-CM | POA: Insufficient documentation

## 2015-08-11 DIAGNOSIS — M25551 Pain in right hip: Secondary | ICD-10-CM | POA: Diagnosis not present

## 2015-08-11 DIAGNOSIS — I4892 Unspecified atrial flutter: Secondary | ICD-10-CM | POA: Diagnosis not present

## 2015-08-11 DIAGNOSIS — Z79891 Long term (current) use of opiate analgesic: Secondary | ICD-10-CM | POA: Diagnosis not present

## 2015-08-11 DIAGNOSIS — F1190 Opioid use, unspecified, uncomplicated: Secondary | ICD-10-CM

## 2015-08-11 MED ORDER — OXYCODONE HCL 10 MG PO TABS: 10.0000 mg | ORAL_TABLET | Freq: Four times a day (QID) | ORAL | Status: DC | PRN

## 2015-08-11 MED ORDER — NALOXONE HCL 4 MG/0.1ML NA LIQD: NASAL | Status: AC

## 2015-08-11 NOTE — Patient Instructions (Signed)
GENERAL RISKS AND COMPLICATIONS  What are the risk, side effects and possible complications? Generally speaking, most procedures are safe.  However, with any procedure there are risks, side effects, and the possibility of complications.  The risks and complications are dependent upon the sites that are lesioned, or the type of nerve block to be performed.  The closer the procedure is to the spine, the more serious the risks are.  Great care is taken when placing the radio frequency needles, block needles or lesioning probes, but sometimes complications can occur. 1. Infection: Any time there is an injection through the skin, there is a risk of infection.  This is why sterile conditions are used for these blocks.  There are four possible types of infection. 1. Localized skin infection. 2. Central Nervous System Infection-This can be in the form of Meningitis, which can be deadly. 3. Epidural Infections-This can be in the form of an epidural abscess, which can cause pressure inside of the spine, causing compression of the spinal cord with subsequent paralysis. This would require an emergency surgery to decompress, and there are no guarantees that the patient would recover from the paralysis. 4. Discitis-This is an infection of the intervertebral discs.  It occurs in about 1% of discography procedures.  It is difficult to treat and it may lead to surgery.        2. Pain: the needles have to go through skin and soft tissues, will cause soreness.       3. Damage to internal structures:  The nerves to be lesioned may be near blood vessels or    other nerves which can be potentially damaged.       4. Bleeding: Bleeding is more common if the patient is taking blood thinners such as  aspirin, Coumadin, Ticiid, Plavix, etc., or if he/she have some genetic predisposition  such as hemophilia. Bleeding into the spinal canal can cause compression of the spinal  cord with subsequent paralysis.  This would require an  emergency surgery to  decompress and there are no guarantees that the patient would recover from the  paralysis.       5. Pneumothorax:  Puncturing of a lung is a possibility, every time a needle is introduced in  the area of the chest or upper back.  Pneumothorax refers to free air around the  collapsed lung(s), inside of the thoracic cavity (chest cavity).  Another two possible  complications related to a similar event would include: Hemothorax and Chylothorax.   These are variations of the Pneumothorax, where instead of air around the collapsed  lung(s), you may have blood or chyle, respectively.       6. Spinal headaches: They may occur with any procedures in the area of the spine.       7. Persistent CSF (Cerebro-Spinal Fluid) leakage: This is a rare problem, but may occur  with prolonged intrathecal or epidural catheters either due to the formation of a fistulous  track or a dural tear.       8. Nerve damage: By working so close to the spinal cord, there is always a possibility of  nerve damage, which could be as serious as a permanent spinal cord injury with  paralysis.       9. Death:  Although rare, severe deadly allergic reactions known as "Anaphylactic  reaction" can occur to any of the medications used.      10. Worsening of the symptoms:  We can always make thing worse.    What are the chances of something like this happening? Chances of any of this occuring are extremely low.  By statistics, you have more of a chance of getting killed in a motor vehicle accident: while driving to the hospital than any of the above occurring .  Nevertheless, you should be aware that they are possibilities.  In general, it is similar to taking a shower.  Everybody knows that you can slip, hit your head and get killed.  Does that mean that you should not shower again?  Nevertheless always keep in mind that statistics do not mean anything if you happen to be on the wrong side of them.  Even if a procedure has a 1  (one) in a 1,000,000 (million) chance of going wrong, it you happen to be that one..Also, keep in mind that by statistics, you have more of a chance of having something go wrong when taking medications.  Who should not have this procedure? If you are on a blood thinning medication (e.g. Coumadin, Plavix, see list of "Blood Thinners"), or if you have an active infection going on, you should not have the procedure.  If you are taking any blood thinners, please inform your physician.  How should I prepare for this procedure?  Do not eat or drink anything at least six hours prior to the procedure.  Bring a driver with you .  It cannot be a taxi.  Come accompanied by an adult that can drive you back, and that is strong enough to help you if your legs get weak or numb from the local anesthetic.  Take all of your medicines the morning of the procedure with just enough water to swallow them.  If you have diabetes, make sure that you are scheduled to have your procedure done first thing in the morning, whenever possible.  If you have diabetes, take only half of your insulin dose and notify our nurse that you have done so as soon as you arrive at the clinic.  If you are diabetic, but only take blood sugar pills (oral hypoglycemic), then do not take them on the morning of your procedure.  You may take them after you have had the procedure.  Do not take aspirin or any aspirin-containing medications, at least eleven (11) days prior to the procedure.  They may prolong bleeding.  Wear loose fitting clothing that may be easy to take off and that you would not mind if it got stained with Betadine or blood.  Do not wear any jewelry or perfume  Remove any nail coloring.  It will interfere with some of our monitoring equipment.  NOTE: Remember that this is not meant to be interpreted as a complete list of all possible complications.  Unforeseen problems may occur.  BLOOD THINNERS The following drugs  contain aspirin or other products, which can cause increased bleeding during surgery and should not be taken for 2 weeks prior to and 1 week after surgery.  If you should need take something for relief of minor pain, you may take acetaminophen which is found in Tylenol,m Datril, Anacin-3 and Panadol. It is not blood thinner. The products listed below are.  Do not take any of the products listed below in addition to any listed on your instruction sheet.  A.P.C or A.P.C with Codeine Codeine Phosphate Capsules #3 Ibuprofen Ridaura  ABC compound Congesprin Imuran rimadil  Advil Cope Indocin Robaxisal  Alka-Seltzer Effervescent Pain Reliever and Antacid Coricidin or Coricidin-D  Indomethacin Rufen    Alka-Seltzer plus Cold Medicine Cosprin Ketoprofen S-A-C Tablets  Anacin Analgesic Tablets or Capsules Coumadin Korlgesic Salflex  Anacin Extra Strength Analgesic tablets or capsules CP-2 Tablets Lanoril Salicylate  Anaprox Cuprimine Capsules Levenox Salocol  Anexsia-D Dalteparin Magan Salsalate  Anodynos Darvon compound Magnesium Salicylate Sine-off  Ansaid Dasin Capsules Magsal Sodium Salicylate  Anturane Depen Capsules Marnal Soma  APF Arthritis pain formula Dewitt's Pills Measurin Stanback  Argesic Dia-Gesic Meclofenamic Sulfinpyrazone  Arthritis Bayer Timed Release Aspirin Diclofenac Meclomen Sulindac  Arthritis pain formula Anacin Dicumarol Medipren Supac  Analgesic (Safety coated) Arthralgen Diffunasal Mefanamic Suprofen  Arthritis Strength Bufferin Dihydrocodeine Mepro Compound Suprol  Arthropan liquid Dopirydamole Methcarbomol with Aspirin Synalgos  ASA tablets/Enseals Disalcid Micrainin Tagament  Ascriptin Doan's Midol Talwin  Ascriptin A/D Dolene Mobidin Tanderil  Ascriptin Extra Strength Dolobid Moblgesic Ticlid  Ascriptin with Codeine Doloprin or Doloprin with Codeine Momentum Tolectin  Asperbuf Duoprin Mono-gesic Trendar  Aspergum Duradyne Motrin or Motrin IB Triminicin  Aspirin  plain, buffered or enteric coated Durasal Myochrisine Trigesic  Aspirin Suppositories Easprin Nalfon Trillsate  Aspirin with Codeine Ecotrin Regular or Extra Strength Naprosyn Uracel  Atromid-S Efficin Naproxen Ursinus  Auranofin Capsules Elmiron Neocylate Vanquish  Axotal Emagrin Norgesic Verin  Azathioprine Empirin or Empirin with Codeine Normiflo Vitamin E  Azolid Emprazil Nuprin Voltaren  Bayer Aspirin plain, buffered or children's or timed BC Tablets or powders Encaprin Orgaran Warfarin Sodium  Buff-a-Comp Enoxaparin Orudis Zorpin  Buff-a-Comp with Codeine Equegesic Os-Cal-Gesic   Buffaprin Excedrin plain, buffered or Extra Strength Oxalid   Bufferin Arthritis Strength Feldene Oxphenbutazone   Bufferin plain or Extra Strength Feldene Capsules Oxycodone with Aspirin   Bufferin with Codeine Fenoprofen Fenoprofen Pabalate or Pabalate-SF   Buffets II Flogesic Panagesic   Buffinol plain or Extra Strength Florinal or Florinal with Codeine Panwarfarin   Buf-Tabs Flurbiprofen Penicillamine   Butalbital Compound Four-way cold tablets Penicillin   Butazolidin Fragmin Pepto-Bismol   Carbenicillin Geminisyn Percodan   Carna Arthritis Reliever Geopen Persantine   Carprofen Gold's salt Persistin   Chloramphenicol Goody's Phenylbutazone   Chloromycetin Haltrain Piroxlcam   Clmetidine heparin Plaquenil   Cllnoril Hyco-pap Ponstel   Clofibrate Hydroxy chloroquine Propoxyphen         Before stopping any of these medications, be sure to consult the physician who ordered them.  Some, such as Coumadin (Warfarin) are ordered to prevent or treat serious conditions such as "deep thrombosis", "pumonary embolisms", and other heart problems.  The amount of time that you may need off of the medication may also vary with the medication and the reason for which you were taking it.  If you are taking any of these medications, please make sure you notify your pain physician before you undergo any  procedures.         Facet Blocks Patient Information  Description: The facets are joints in the spine between the vertebrae.  Like any joints in the body, facets can become irritated and painful.  Arthritis can also effect the facets.  By injecting steroids and local anesthetic in and around these joints, we can temporarily block the nerve supply to them.  Steroids act directly on irritated nerves and tissues to reduce selling and inflammation which often leads to decreased pain.  Facet blocks may be done anywhere along the spine from the neck to the low back depending upon the location of your pain.   After numbing the skin with local anesthetic (like Novocaine), a small needle is passed onto the facet joints under x-ray guidance.    You may experience a sensation of pressure while this is being done.  The entire block usually lasts about 15-25 minutes.   Conditions which may be treated by facet blocks:   Low back/buttock pain  Neck/shoulder pain  Certain types of headaches  Preparation for the injection:  1. Do not eat any solid food or dairy products within 8 hours of your appointment. 2. You may drink clear liquid up to 3 hours before appointment.  Clear liquids include water, black coffee, juice or soda.  No milk or cream please. 3. You may take your regular medication, including pain medications, with a sip of water before your appointment.  Diabetics should hold regular insulin (if taken separately) and take 1/2 normal NPH dose the morning of the procedure.  Carry some sugar containing items with you to your appointment. 4. A driver must accompany you and be prepared to drive you home after your procedure. 5. Bring all your current medications with you. 6. An IV may be inserted and sedation may be given at the discretion of the physician. 7. A blood pressure cuff, EKG and other monitors will often be applied during the procedure.  Some patients may need to have extra oxygen  administered for a short period. 8. You will be asked to provide medical information, including your allergies and medications, prior to the procedure.  We must know immediately if you are taking blood thinners (like Coumadin/Warfarin) or if you are allergic to IV iodine contrast (dye).  We must know if you could possible be pregnant.  Possible side-effects:   Bleeding from needle site  Infection (rare, may require surgery)  Nerve injury (rare)  Numbness & tingling (temporary)  Difficulty urinating (rare, temporary)  Spinal headache (a headache worse with upright posture)  Light-headedness (temporary)  Pain at injection site (serveral days)  Decreased blood pressure (rare, temporary)  Weakness in arm/leg (temporary)  Pressure sensation in back/neck (temporary)   Call if you experience:   Fever/chills associated with headache or increased back/neck pain  Headache worsened by an upright position  New onset, weakness or numbness of an extremity below the injection site  Hives or difficulty breathing (go to the emergency room)  Inflammation or drainage at the injection site(s)  Severe back/neck pain greater than usual  New symptoms which are concerning to you  Please note:  Although the local anesthetic injected can often make your back or neck feel good for several hours after the injection, the pain will likely return. It takes 3-7 days for steroids to work.  You may not notice any pain relief for at least one week.  If effective, we will often do a series of 2-3 injections spaced 3-6 weeks apart to maximally decrease your pain.  After the initial series, you may be a candidate for a more permanent nerve block of the facets.  If you have any questions, please call [phone] [facility] 

## 2015-08-11 NOTE — Progress Notes (Signed)
Safety precautions to be maintained throughout the outpatient stay will include: orient to surroundings, keep bed in low position, maintain call bell within reach at all times, provide assistance with transfer out of bed and ambulation.  

## 2015-08-11 NOTE — Progress Notes (Signed)
Patient's Name: Molly Rogers  Patient type: Established  MRN: [id number]  Service setting: Ambulatory outpatient  DOB: [birth year]  Location: [facility]  DOS: 08/11/2015  Primary Care Physician: Molly Rogers., PA  Note by: Kathlen Brunswick. Dossie Arbour, M.D, DABA, DABAPM, DABPM, DABIPP, FIPP  Referring Physician: Christie Nottingham, PA  Specialty: Board-Certified Interventional Pain Management  Last Visit to Pain Management: 07/28/2015   Primary Reason(s) for Visit: Encounter for prescription drug management (Level of risk: moderate) CC: Back Pain; Hip Pain; and Knee Pain   HPI  [name] is a 55 y.o. year old, female patient, who returns today as an established patient. She has SOB (shortness of breath); Atrial flutter ([place]); Abdominal discomfort; Essential hypertension; Anxiety; Acquired aphasia; Appendicular ataxia; CCF (congestive cardiac failure) ([place]); Chronic headache; CAFL (chronic airflow limitation) ([place]); Dermatitis, eczematoid; Benign essential tremor; BP (high blood pressure); ILD (interstitial lung disease) ([place]); Neuritis or radiculitis due to rupture of lumbar intervertebral disc; Benign neoplasm of meninges (HCC); MI (mitral incompetence); Neurosis, posttraumatic; Apnea, sleep; Transient cerebral ischemia due to atrial fibrillation ([place]); Has a tremor; Chronic trochanteric bursitis; Fibromyalgia; Atrial fibrillation ([place]); Abnormal uterine bleeding; Congestive heart failure ([place]); Chronic obstructive pulmonary disease ([place]); Interstitial lung disease ([place]); Neoplasm of meninges ([place]); Spasm; Female climacteric state; Sacrococcygeal disorders, not elsewhere classified; Female genuine stress incontinence; Breathlessness on exertion; Paroxysmal atrial fibrillation ([place]); Obstructive apnea; Combined fat and carbohydrate induced hyperlipemia; Benign essential HTN; Chronic pain; Long term current use of opiate analgesic; Lumbar spondylosis; Chronic lower extremity  pain (Location of Tertiary source of pain) (Bilateral) (L>R); Chronic low back pain (Location of Primary Source of Pain) (Bilateral) (Midline) (R>L); Chronic hip pain (Location of Secondary source of pain) (Bilateral) (R>L); Osteoarthritis of hips (Location of Secondary source of pain) (Bilateral) (R>L); Chronic knee pain (Bilateral) (L>R); Chronic neck pain (posterior and central); 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 Her primarily concern today is the Back Pain; Hip Pain; and Knee Pain   Pain Assessment: Self-Reported Pain Score: 5  Reported level is compatible with observation Pain Type: Chronic pain Pain Location: Back Pain Orientation: Lower Pain Descriptors / Indicators: Aching, Constant, Radiating Pain Frequency: Constant  The patient comes into the clinics today for pharmacological management of her chronic pain. I last saw this patient on 07/28/2015. The patient  reports that she does not use illicit drugs. Her body mass index is 40.75 kg/(m^2).  Date of Last Visit: 07/11/15 Service Provided on Last Visit: Evaluation  Controlled Substance Pharmacotherapy Assessment & REMS (Risk Evaluation and Mitigation Strategy)  Analgesic: Oxycodone IR 10 mg every 6 hours (40 mg/day of oxycodone) Pill Count: Did not have any medication last. MME/day: 60 mg/day Pharmacokinetics: Onset of action (Liberation/Absorption): Within expected pharmacological parameters Time to Peak effect  (Distribution): Timing and results are as within normal expected parameters Duration of action (Metabolism/Excretion): Within normal limits for medication Pharmacodynamics: Analgesic Effect: More than 50% Activity Facilitation: Medication(s) allow patient to sit, stand, walk, and do the basic ADLs Perceived Effectiveness: Described as relatively effective, allowing for increase in activities of daily living (ADL) Side-effects or Adverse reactions: None  reported Monitoring: [place] PMP: Online review of the past 97-month period conducted. Compliant with practice rules and regulations UDS Results/interpretation: The patient's last UDS was done on 06/09/2015 and at that point she had no medication and the system. The test was within normal limits with no unexpected results. The patient has been reminded of the CDC guidelines not to combine opioids and benzodiazepines. Medication Assessment Form: Reviewed. Patient indicates being compliant with therapy Treatment compliance: Compliant Risk Assessment: Aberrant Behavior: None observed today Substance Use Disorder (SUD) Risk Level: Moderate-to-high Risk of opioid abuse or dependence: 0.7-3.0% with doses ? 36 MME/day and 6.1-26% with doses ? 120 MME/day. Opioid Risk Tool (ORT) Score:  6 Moderate Risk for SUD (Score between 4-7) Depression Scale Score: PHQ-2: PHQ-2 Total Score: 0 No depression (0) PHQ-9: PHQ-9 Total Score: 0 No depression (0-4)  Pharmacologic Plan: No change in therapy, at this time  Laboratory Chemistry  Inflammation Markers Lab Results  Component Value Date   ESRSEDRATE 14 08/08/2015   CRP <0.5 08/08/2015    Renal Function Lab Results  Component Value Date   BUN 13 08/08/2015   CREATININE 0.86 08/08/2015   GFRAA >60 08/08/2015   GFRNONAA >60 08/08/2015    Hepatic Function Lab Results  Component Value Date   AST 20 08/08/2015   ALT 17 08/08/2015   ALBUMIN 3.9 08/08/2015    Electrolytes Lab Results  Component  Value Date   NA 139 08/08/2015   K 3.7 08/08/2015   CL 103 08/08/2015   CALCIUM 9.1 08/08/2015   MG 2.1 08/08/2015    Pain Modulating Vitamins Lab Results  Component Value Date   VITAMINB12 385 08/08/2015    Coagulation Parameters No results found for: INR, LABPROT  Note: I personally reviewed the above data. Results made available to patient.  Recent Diagnostic Imaging  No results found.  Meds  The patient has a current medication list which includes the following prescription(s): albuterol, alprazolam, bupropion, cetirizine, fluticasone-salmeterol, losartan-hydrochlorothiazide, lubiprostone, pravastatin, solifenacin, tiotropium, naloxone hcl, and oxycodone hcl.  Current Outpatient Prescriptions on File Prior to Visit  Medication Sig  . albuterol (PROVENTIL) (2.5 MG/3ML) 0.083% nebulizer solution Take 2.5 mg by nebulization every 6 (six) hours as needed for wheezing.  Marland Kitchen ALPRAZolam (XANAX) 1 MG tablet Take 1 mg by mouth 3 (three) times daily.  Marland Kitchen buPROPion (WELLBUTRIN XL) 150 MG 24 hr tablet Take 300 mg by mouth daily.   . cetirizine (ZYRTEC) 10 MG tablet Take 10 mg by mouth daily.  . Fluticasone-Salmeterol (ADVAIR DISKUS) 250-50 MCG/DOSE AEPB Inhale 1 puff into the lungs 2 (two) times daily.  Marland Kitchen losartan-hydrochlorothiazide (HYZAAR) 100-25 MG tablet Take 1 tablet by mouth.  . lubiprostone (AMITIZA) 8 MCG capsule Take 1 tablet by mouth 2 (two) times daily.   . pravastatin (PRAVACHOL) 40 MG tablet Take 40 mg by mouth daily.  . solifenacin (VESICARE) 10 MG tablet Take 1 tablet by mouth daily. Reported on 07/11/2015  . tiotropium (SPIRIVA HANDIHALER) 18 MCG inhalation capsule Place 1 tablet into inhaler and inhale 1 day or 1 dose.   No current facility-administered medications on file prior to visit.    ROS  Constitutional: Denies any fever or chills Gastrointestinal: No reported hemesis, hematochezia, vomiting, or acute GI distress Musculoskeletal: Denies any acute onset joint  swelling, redness, loss of ROM, or weakness Neurological: No reported episodes of acute onset apraxia, aphasia, dysarthria, agnosia, amnesia, paralysis, loss of coordination, or loss of consciousness  Allergies  Ms. Gilleo is allergic to amitriptyline; morphine  and related; naproxen; and prednisone.  [place]  Medical:  Ms. Laminack  has a past medical history of Hypertension; A-fib ([place]); Hyperlipidemia; Asthma; Ruptured appendix; COPD (chronic obstructive pulmonary disease) ([place]); Parkinson's disease (tremor, stiffness, slow motion, unstable posture) ([place]); Brain tumor ([place]); Cancer ([place]); Bursitis; DJD (degenerative joint disease) of hip; Chronic back pain; Fibromyalgia; Apnea, sleep; CHF (congestive heart failure) ([place]); Arthritis of knee, degenerative (08/16/2013); Back pain at L4-L5 level (12/18/2014); DDD (degenerative disc disease), lumbar (12/18/2014); Degenerative arthritis of lumbar spine (08/20/2013); Derangement of knee (09/28/2013); Hip bursitis (12/18/2014); and Spondylosis of lumbosacral region without myelopathy or radiculopathy (12/18/2014). Family: family history includes Heart disease in her father and mother; Heart failure in her father; Hyperlipidemia in her father; Hypertension in her father. Surgical:  has past surgical history that includes Appendectomy; Carpal tunnel release; and Finger surgery. Tobacco:  reports that she quit smoking about 2 years ago. Her smoking use included Cigarettes. She smoked 0.00 packs per day for 35 years. She has never used smokeless tobacco. Alcohol:  reports that she drinks about 0.6 oz of alcohol per week. Drug:  reports that she does not use illicit drugs.  Constitutional Exam  Vitals: Blood pressure 106/73, pulse 81, temperature 98.3 F (36.8 C), temperature source Oral, resp. rate 16, height 5\' 3"  (1.6 m), weight 230 lb (104.327 kg), SpO2 100 %. General appearance: Well nourished, well developed, and well hydrated. In no acute distress Calculated  BMI/Body habitus: Body mass index is 40.75 kg/(m^2). (>40 kg/m2) Extreme obesity (Class III) - 254% higher incidence of chronic pain. Patient reminded that she needs to start working on her weight to bring it down below a BMI of 30. Psych/Mental status: Alert and oriented x 3 (person, place, & time) Eyes: PERLA Respiratory: No evidence of acute respiratory distress  Cervical Spine Exam  Inspection: No masses, redness, or swelling Alignment: Symmetrical ROM: Functional: ROM is within functional limits [facility]) Stability: No instability detected Muscle strength & Tone: Functionally intact Sensory: Unimpaired Palpation: No complaints of tenderness  Upper Extremity (UE) Exam    Side: Right upper extremity  Side: Left upper extremity  Inspection: No masses, redness, swelling, or asymmetry  Inspection: No masses, redness, swelling, or asymmetry  ROM:  ROM:  Functional: ROM is within functional limits [facility])  Functional: ROM is within functional limits [facility])  Muscle strength & Tone: Functionally intact  Muscle strength & Tone: Functionally intact  Sensory: Unimpaired  Sensory: Unimpaired  Palpation: Non-contributory  Palpation: Non-contributory   Thoracic Spine Exam  Inspection: No masses, redness, or swelling Alignment: Symmetrical ROM: Functional: ROM is within functional limits [facility]) Stability: No instability detected Sensory: Unimpaired Muscle strength & Tone: Functionally intact Palpation: No complaints of tenderness  Lumbar Spine Exam  Inspection: No masses, redness, or swelling Alignment: Symmetrical ROM: Functional: ROM is within functional limits [facility]) Stability: No instability detected Muscle strength & Tone: Functionally intact Sensory: Unimpaired Palpation: No complaints of tenderness Provocative Tests: Lumbar Hyperextension and rotation test: Positive for bilateral lumbar facet pain. Patrick's Maneuver: deferred  Gait & Posture Assessment  Ambulation:  Unassisted Gait: Unaffected Posture: WNL  Lower Extremity Exam    Side: Right lower extremity  Side: Left lower extremity  Inspection: No masses, redness, swelling, or asymmetry ROM:  Inspection: No masses, redness, swelling, or asymmetry ROM:  Functional: ROM is within functional limits [facility])  Functional: ROM is within functional limits [facility])  Muscle strength & Tone: Functionally intact  Muscle strength & Tone: Functionally intact  Sensory: Unimpaired  Sensory: Unimpaired  Palpation: Non-contributory  Palpation: Non-contributory   Assessment & Plan  Primary Diagnosis & Pertinent Problem List: The primary encounter diagnosis was Chronic low back pain (Location of Primary Source of Pain) (Bilateral) (Midline) (R>L). Diagnoses of Lumbar facet syndrome, Lumbar spondylosis, unspecified spinal osteoarthritis, Chronic pain, Encounter for therapeutic drug level monitoring, Long term current use of opiate analgesic, and Opiate use (45 MME/Day) were also pertinent to this visit.  Visit Diagnosis: 1. Chronic low back pain (Location of Primary Source of Pain) (Bilateral) (Midline) (R>L)   2. Lumbar facet syndrome   3. Lumbar spondylosis, unspecified spinal osteoarthritis   4. Chronic pain   5. Encounter for therapeutic drug level monitoring   6. Long term current use of opiate analgesic   7. Opiate use (45 MME/Day)     Problems updated and reviewed during this visit: Problem  Opiate use (60 MME/Day)   Oxycodone IR 10 mg every 6 hours (40 mg/day of oxycodone)   Neurosis, Posttraumatic    Problem-specific Plan(s): No problem-specific assessment & plan notes found for this encounter.  No new assessment & plan notes have been filed under this hospital service since the last note was generated. Service: Pain Management   Plan of Care   Problem List Items Addressed This Visit      High   Chronic low back pain (Location of Primary Source of Pain) (Bilateral) (Midline) (R>L) - Primary  (Chronic)   Relevant Medications   Oxycodone HCl 10 MG TABS   Chronic pain (Chronic)   Relevant Medications   Oxycodone HCl 10 MG TABS   Lumbar facet syndrome (Chronic)   Relevant Medications   Oxycodone HCl 10 MG TABS   Other Relevant Orders   LUMBAR FACET(MEDIAL BRANCH NERVE BLOCK) MBNB   Lumbar spondylosis (Chronic)   Relevant Medications   Oxycodone HCl 10 MG TABS     Medium   Encounter for therapeutic drug level monitoring   Long term current use of opiate analgesic (Chronic)   Relevant Medications   Naloxone HCl (NARCAN) 4 MG/0.1ML LIQD   Opiate use (60 MME/Day) (Chronic)       Pharmacotherapy (Medications Ordered): Meds ordered this encounter  Medications  . Oxycodone HCl 10 MG TABS    Sig: Take 1 tablet (10 mg total) by mouth every 6 (six) hours as needed.    Dispense:  120 tablet    Refill:  0    Do not place this medication, or any other prescription from our practice, on "Automatic Refill". Patient may have prescription filled one day early if pharmacy is closed on scheduled refill date. Do not fill until: 08/11/15 To last until: 09/10/15  . Naloxone HCl (NARCAN) 4 MG/0.1ML LIQD    Sig: Spray into one nostril. Repeat with second device into other nostril after 2-3 minutes if no or minimal response.    Dispense:  2 each    Refill:  0    Narcan Nasal Spray. (2 pack) Please provide the patient with clear instructions on the use of this device/medication.    Lab-work & Procedure Ordered: Orders Placed This Encounter  Procedures  . LUMBAR FACET(MEDIAL BRANCH NERVE BLOCK) MBNB    Imaging Ordered: None  Interventional Therapies: Scheduled:  Diagnostic bilateral lumbar facet block under fluoroscopic guidance and IV sedation.    Considering:   1. For the low back pain, bilateral diagnostic lumbar facet block under fluoroscopic guidance and IV sedation.  2. Before the hip pain, bilateral intra-articular hip blocks under fluoroscopic guidance and IV  sedation.   3. For the knee pain, bilateral intra-articular knee injections under fluoroscopic guidance, no sedation.  4. For low back pain and leg pain, left lumbar epidural steroid injection under fluoroscopic guidance and IV sedation.   PRN Procedures:  None set up at this point.    Referral(s) or Consult(s): None at this time.  New Prescriptions   NALOXONE HCL (NARCAN) 4 MG/0.1ML LIQD    Spray into one nostril. Repeat with second device into other nostril after 2-3 minutes if no or minimal response.   OXYCODONE HCL 10 MG TABS    Take 1 tablet (10 mg total) by mouth every 6 (six) hours as needed.    Medications administered during this visit: Ms. Charon had no medications administered during this visit.  Requested PM Follow-up: Return for Procedure (Scheduled).  Future Appointments Date Time Provider [place]  08/19/2015 10:15 AM Milinda Pointer, MD [facility] None    Primary Care Physician: Molly Rogers., PA Location: [facility] Note by: Kathlen Brunswick Dossie Arbour, M.D, DABA, DABAPM, DABPM, DABIPP, FIPP  Pain Score Disclaimer: We use the NRS-11 scale. This is a self-reported, subjective measurement of pain severity with only modest accuracy. It is used primarily to identify changes within a particular patient. It must be understood that outpatient pain scales are significantly less accurate that those used for research, where they can be applied under ideal controlled circumstances with minimal exposure to variables. In reality, the score is likely to be a combination of pain intensity and pain affect, where pain affect describes the degree of emotional arousal or changes in action readiness caused by the sensory experience of pain. Factors such as social and work situation, setting, emotional state, anxiety levels, expectation, and prior pain experience may influence pain perception and show large inter-individual differences that may also be affected by  time variables.  Patient instructions provided during this appointment: Patient Instructions   GENERAL RISKS AND COMPLICATIONS  What are the risk, side effects and possible complications? Generally speaking, most procedures are safe.  However, with any procedure there are risks, side effects, and the possibility of complications.  The risks and complications are dependent upon the sites that are lesioned, or the type of nerve block to be performed.  The closer the procedure is to the spine, the more serious the risks are.  Great care is taken when placing the radio frequency needles, block needles or lesioning probes, but sometimes complications can occur. 1. Infection: Any time there is an injection through the skin, there is a risk of infection.  This is why sterile conditions are used for these blocks.  There are four possible types of infection. 1. Localized skin infection. 2. Central Nervous System Infection-This can be in the form of Meningitis, which can be deadly. 3. Epidural Infections-This can be in the form of an epidural abscess, which can cause pressure inside of the spine, causing compression of the spinal cord with subsequent paralysis. This would require an emergency surgery to decompress, and there are no guarantees that the patient would recover from the paralysis. 4. Discitis-This is an infection of the intervertebral discs.  It occurs in about 1% of discography procedures.  It is difficult to treat and it may lead to surgery.        2. Pain: the needles have to go through skin and soft tissues, will cause soreness.       3. Damage to internal structures:  The nerves to be lesioned may  be near blood vessels or    other nerves which can be potentially damaged.       4. Bleeding: Bleeding is more common if the patient is taking blood thinners such as  aspirin, Coumadin, Ticiid, Plavix, etc., or if he/she have some genetic predisposition  such as hemophilia. Bleeding into the spinal  canal can cause compression of the spinal  cord with subsequent paralysis.  This would require an emergency surgery to  decompress and there are no guarantees that the patient would recover from the  paralysis.       5. Pneumothorax:  Puncturing of a lung is a possibility, every time a needle is introduced in  the area of the chest or upper back.  Pneumothorax refers to free air around the  collapsed lung(s), inside of the thoracic cavity (chest cavity).  Another two possible  complications related to a similar event would include: Hemothorax and Chylothorax.   These are variations of the Pneumothorax, where instead of air around the collapsed  lung(s), you may have blood or chyle, respectively.       6. Spinal headaches: They may occur with any procedures in the area of the spine.       7. Persistent CSF (Cerebro-Spinal Fluid) leakage: This is a rare problem, but may occur  with prolonged intrathecal or epidural catheters either due to the formation of a fistulous  track or a dural tear.       8. Nerve damage: By working so close to the spinal cord, there is always a possibility of  nerve damage, which could be as serious as a permanent spinal cord injury with  paralysis.       9. Death:  Although rare, severe deadly allergic reactions known as "Anaphylactic  reaction" can occur to any of the medications used.      10. Worsening of the symptoms:  We can always make thing worse.  What are the chances of something like this happening? Chances of any of this occuring are extremely low.  By statistics, you have more of a chance of getting killed in a motor vehicle accident: while driving to the hospital than any of the above occurring .  Nevertheless, you should be aware that they are possibilities.  In general, it is similar to taking a shower.  Everybody knows that you can slip, hit your head and get killed.  Does that mean that you should not shower again?  Nevertheless always keep in mind that statistics  do not mean anything if you happen to be on the wrong side of them.  Even if a procedure has a 1 (one) in a 1,000,000 (million) chance of going wrong, it you happen to be that one..Also, keep in mind that by statistics, you have more of a chance of having something go wrong when taking medications.  Who should not have this procedure? If you are on a blood thinning medication (e.g. Coumadin, Plavix, see list of "Blood Thinners"), or if you have an active infection going on, you should not have the procedure.  If you are taking any blood thinners, please inform your physician.  How should I prepare for this procedure?  Do not eat or drink anything at least six hours prior to the procedure.  Bring a driver with you .  It cannot be a taxi.  Come accompanied by an adult that can drive you back, and that is strong enough to help you if your legs get weak  or numb from the local anesthetic.  Take all of your medicines the morning of the procedure with just enough water to swallow them.  If you have diabetes, make sure that you are scheduled to have your procedure done first thing in the morning, whenever possible.  If you have diabetes, take only half of your insulin dose and notify our nurse that you have done so as soon as you arrive at the clinic.  If you are diabetic, but only take blood sugar pills (oral hypoglycemic), then do not take them on the morning of your procedure.  You may take them after you have had the procedure.  Do not take aspirin or any aspirin-containing medications, at least eleven (11) days prior to the procedure.  They may prolong bleeding.  Wear loose fitting clothing that may be easy to take off and that you would not mind if it got stained with Betadine or blood.  Do not wear any jewelry or perfume  Remove any nail coloring.  It will interfere with some of our monitoring equipment.  NOTE: Remember that this is not meant to be interpreted as a complete list of all  possible complications.  Unforeseen problems may occur.  BLOOD THINNERS The following drugs contain aspirin or other products, which can cause increased bleeding during surgery and should not be taken for 2 weeks prior to and 1 week after surgery.  If you should need take something for relief of minor pain, you may take acetaminophen which is found in Tylenol,m Datril, Anacin-3 and Panadol. It is not blood thinner. The products listed below are.  Do not take any of the products listed below in addition to any listed on your instruction sheet.  A.P.C or A.P.C with Codeine Codeine Phosphate Capsules #3 Ibuprofen Ridaura  ABC compound Congesprin Imuran rimadil  Advil Cope Indocin Robaxisal  Alka-Seltzer Effervescent Pain Reliever and Antacid Coricidin or Coricidin-D  Indomethacin Rufen  Alka-Seltzer plus Cold Medicine Cosprin Ketoprofen S-A-C Tablets  Anacin Analgesic Tablets or Capsules Coumadin Korlgesic Salflex  Anacin Extra Strength Analgesic tablets or capsules CP-2 Tablets Lanoril Salicylate  Anaprox Cuprimine Capsules Levenox Salocol  Anexsia-D Dalteparin Magan Salsalate  Anodynos Darvon compound Magnesium Salicylate Sine-off  Ansaid Dasin Capsules Magsal Sodium Salicylate  Anturane Depen Capsules Marnal Soma  APF Arthritis pain formula Dewitt's Pills Measurin Stanback  Argesic Dia-Gesic Meclofenamic Sulfinpyrazone  Arthritis Bayer Timed Release Aspirin Diclofenac Meclomen Sulindac  Arthritis pain formula Anacin Dicumarol Medipren Supac  Analgesic (Safety coated) Arthralgen Diffunasal Mefanamic Suprofen  Arthritis Strength Bufferin Dihydrocodeine Mepro Compound Suprol  Arthropan liquid Dopirydamole Methcarbomol with Aspirin Synalgos  ASA tablets/Enseals Disalcid Micrainin Tagament  Ascriptin Doan's Midol Talwin  Ascriptin A/D Dolene Mobidin Tanderil  Ascriptin Extra Strength Dolobid Moblgesic Ticlid  Ascriptin with Codeine Doloprin or Doloprin with Codeine Momentum Tolectin   Asperbuf Duoprin Mono-gesic Trendar  Aspergum Duradyne Motrin or Motrin IB Triminicin  Aspirin plain, buffered or enteric coated Durasal Myochrisine Trigesic  Aspirin Suppositories Easprin Nalfon Trillsate  Aspirin with Codeine Ecotrin Regular or Extra Strength Naprosyn Uracel  Atromid-S Efficin Naproxen Ursinus  Auranofin Capsules Elmiron Neocylate Vanquish  Axotal Emagrin Norgesic Verin  Azathioprine Empirin or Empirin with Codeine Normiflo Vitamin E  Azolid Emprazil Nuprin Voltaren  Bayer Aspirin plain, buffered or children's or timed BC Tablets or powders Encaprin Orgaran Warfarin Sodium  Buff-a-Comp Enoxaparin Orudis Zorpin  Buff-a-Comp with Codeine Equegesic Os-Cal-Gesic   Buffaprin Excedrin plain, buffered or Extra Strength Oxalid   Bufferin Arthritis Strength Feldene Oxphenbutazone  Bufferin plain or Extra Strength Feldene Capsules Oxycodone with Aspirin   Bufferin with Codeine Fenoprofen Fenoprofen Pabalate or Pabalate-SF   Buffets II Flogesic Panagesic   Buffinol plain or Extra Strength Florinal or Florinal with Codeine Panwarfarin   Buf-Tabs Flurbiprofen Penicillamine   Butalbital Compound Four-way cold tablets Penicillin   Butazolidin Fragmin Pepto-Bismol   Carbenicillin Geminisyn Percodan   Carna Arthritis Reliever Geopen Persantine   Carprofen Gold's salt Persistin   Chloramphenicol Goody's Phenylbutazone   Chloromycetin Haltrain Piroxlcam   Clmetidine heparin Plaquenil   Cllnoril Hyco-pap Ponstel   Clofibrate Hydroxy chloroquine Propoxyphen         Before stopping any of these medications, be sure to consult the physician who ordered them.  Some, such as Coumadin (Warfarin) are ordered to prevent or treat serious conditions such as "deep thrombosis", "pumonary embolisms", and other heart problems.  The amount of time that you may need off of the medication may also vary with the medication and the reason for which you were taking it.  If you are taking any of these  medications, please make sure you notify your pain physician before you undergo any procedures.         Facet Blocks Patient Information  Description: The facets are joints in the spine between the vertebrae.  Like any joints in the body, facets can become irritated and painful.  Arthritis can also effect the facets.  By injecting steroids and local anesthetic in and around these joints, we can temporarily block the nerve supply to them.  Steroids act directly on irritated nerves and tissues to reduce selling and inflammation which often leads to decreased pain.  Facet blocks may be done anywhere along the spine from the neck to the low back depending upon the location of your pain.   After numbing the skin with local anesthetic (like Novocaine), a small needle is passed onto the facet joints under x-ray guidance.  You may experience a sensation of pressure while this is being done.  The entire block usually lasts about 15-25 minutes.   Conditions which may be treated by facet blocks:   Low back/buttock pain  Neck/shoulder pain  Certain types of headaches  Preparation for the injection:  1. Do not eat any solid food or dairy products within 8 hours of your appointment. 2. You may drink clear liquid up to 3 hours before appointment.  Clear liquids include water, black coffee, juice or soda.  No milk or cream please. 3. You may take your regular medication, including pain medications, with a sip of water before your appointment.  Diabetics should hold regular insulin (if taken separately) and take 1/2 normal NPH dose the morning of the procedure.  Carry some sugar containing items with you to your appointment. 4. A driver must accompany you and be prepared to drive you home after your procedure. 5. Bring all your current medications with you. 6. An IV may be inserted and sedation may be given at the discretion of the physician. 7. A blood pressure cuff, EKG and other monitors will often  be applied during the procedure.  Some patients may need to have extra oxygen administered for a short period. 8. You will be asked to provide medical information, including your allergies and medications, prior to the procedure.  We must know immediately if you are taking blood thinners (like Coumadin/Warfarin) or if you are allergic to IV iodine contrast (dye).  We must know if you could possible be pregnant.  Possible  side-effects:   Bleeding from needle site  Infection (rare, may require surgery)  Nerve injury (rare)  Numbness & tingling (temporary)  Difficulty urinating (rare, temporary)  Spinal headache (a headache worse with upright posture)  Light-headedness (temporary)  Pain at injection site (serveral days)  Decreased blood pressure (rare, temporary)  Weakness in arm/leg (temporary)  Pressure sensation in back/neck (temporary)   Call if you experience:   Fever/chills associated with headache or increased back/neck pain  Headache worsened by an upright position  New onset, weakness or numbness of an extremity below the injection site  Hives or difficulty breathing (go to the emergency room)  Inflammation or drainage at the injection site(s)  Severe back/neck pain greater than usual  New symptoms which are concerning to you  Please note:  Although the local anesthetic injected can often make your back or neck feel good for several hours after the injection, the pain will likely return. It takes 3-7 days for steroids to work.  You may not notice any pain relief for at least one week.  If effective, we will often do a series of 2-3 injections spaced 3-6 weeks apart to maximally decrease your pain.  After the initial series, you may be a candidate for a more permanent nerve block of the facets.  If you have any questions, please call #[phone]) [facility]

## 2015-08-12 LAB — 25-HYDROXYVITAMIN D LCMS D2+D3
25-HYDROXY, VITAMIN D-2: 1 ng/mL
25-HYDROXY, VITAMIN D-3: 33 ng/mL
25-HYDROXY, VITAMIN D: 34 ng/mL

## 2015-08-15 ENCOUNTER — Telehealth: Payer: Self-pay | Admitting: *Deleted

## 2015-08-15 ENCOUNTER — Telehealth: Payer: Self-pay

## 2015-08-15 NOTE — Telephone Encounter (Signed)
Called patient re; her itching and percocet Rx.  Patient states that itching was really bad on yesterday but reports no rash or redness.  Told patient that the pain medication could cause itching and if it becomes severe to stop the medication. Patient verbalizes u/o information.

## 2015-08-15 NOTE — Telephone Encounter (Signed)
Returned pt call LVM ...td

## 2015-08-15 NOTE — Telephone Encounter (Signed)
Pt was prescribed percocet pt does not know if all the itching she is having is normal

## 2015-08-19 ENCOUNTER — Encounter: Payer: Self-pay | Admitting: Pain Medicine

## 2015-08-19 ENCOUNTER — Ambulatory Visit: Payer: Medicaid Other | Attending: Pain Medicine | Admitting: Pain Medicine

## 2015-08-19 VITALS — BP 108/77 | HR 65 | Temp 97.3°F | Resp 10 | Ht 63.0 in | Wt 220.0 lb

## 2015-08-19 DIAGNOSIS — R270 Ataxia, unspecified: Secondary | ICD-10-CM | POA: Diagnosis not present

## 2015-08-19 DIAGNOSIS — G459 Transient cerebral ischemic attack, unspecified: Secondary | ICD-10-CM | POA: Insufficient documentation

## 2015-08-19 DIAGNOSIS — M7060 Trochanteric bursitis, unspecified hip: Secondary | ICD-10-CM | POA: Diagnosis not present

## 2015-08-19 DIAGNOSIS — M533 Sacrococcygeal disorders, not elsewhere classified: Secondary | ICD-10-CM | POA: Diagnosis not present

## 2015-08-19 DIAGNOSIS — R109 Unspecified abdominal pain: Secondary | ICD-10-CM | POA: Diagnosis not present

## 2015-08-19 DIAGNOSIS — N393 Stress incontinence (female) (male): Secondary | ICD-10-CM | POA: Insufficient documentation

## 2015-08-19 DIAGNOSIS — D329 Benign neoplasm of meninges, unspecified: Secondary | ICD-10-CM | POA: Insufficient documentation

## 2015-08-19 DIAGNOSIS — J449 Chronic obstructive pulmonary disease, unspecified: Secondary | ICD-10-CM | POA: Diagnosis not present

## 2015-08-19 DIAGNOSIS — M797 Fibromyalgia: Secondary | ICD-10-CM | POA: Insufficient documentation

## 2015-08-19 DIAGNOSIS — I509 Heart failure, unspecified: Secondary | ICD-10-CM | POA: Insufficient documentation

## 2015-08-19 DIAGNOSIS — I10 Essential (primary) hypertension: Secondary | ICD-10-CM | POA: Insufficient documentation

## 2015-08-19 DIAGNOSIS — I4891 Unspecified atrial fibrillation: Secondary | ICD-10-CM | POA: Diagnosis not present

## 2015-08-19 DIAGNOSIS — G4730 Sleep apnea, unspecified: Secondary | ICD-10-CM | POA: Diagnosis not present

## 2015-08-19 DIAGNOSIS — I340 Nonrheumatic mitral (valve) insufficiency: Secondary | ICD-10-CM | POA: Insufficient documentation

## 2015-08-19 DIAGNOSIS — J988 Other specified respiratory disorders: Secondary | ICD-10-CM | POA: Insufficient documentation

## 2015-08-19 DIAGNOSIS — I4892 Unspecified atrial flutter: Secondary | ICD-10-CM | POA: Diagnosis not present

## 2015-08-19 DIAGNOSIS — M545 Low back pain: Secondary | ICD-10-CM

## 2015-08-19 DIAGNOSIS — M7731 Calcaneal spur, right foot: Secondary | ICD-10-CM | POA: Insufficient documentation

## 2015-08-19 DIAGNOSIS — G8929 Other chronic pain: Secondary | ICD-10-CM | POA: Diagnosis not present

## 2015-08-19 DIAGNOSIS — Z6841 Body Mass Index (BMI) 40.0 and over, adult: Secondary | ICD-10-CM | POA: Insufficient documentation

## 2015-08-19 DIAGNOSIS — G40802 Other epilepsy, not intractable, without status epilepticus: Secondary | ICD-10-CM | POA: Diagnosis not present

## 2015-08-19 DIAGNOSIS — G250 Essential tremor: Secondary | ICD-10-CM | POA: Insufficient documentation

## 2015-08-19 DIAGNOSIS — L309 Dermatitis, unspecified: Secondary | ICD-10-CM | POA: Diagnosis not present

## 2015-08-19 DIAGNOSIS — R0602 Shortness of breath: Secondary | ICD-10-CM | POA: Diagnosis not present

## 2015-08-19 DIAGNOSIS — I480 Paroxysmal atrial fibrillation: Secondary | ICD-10-CM | POA: Insufficient documentation

## 2015-08-19 DIAGNOSIS — N939 Abnormal uterine and vaginal bleeding, unspecified: Secondary | ICD-10-CM | POA: Diagnosis not present

## 2015-08-19 DIAGNOSIS — M47816 Spondylosis without myelopathy or radiculopathy, lumbar region: Secondary | ICD-10-CM

## 2015-08-19 DIAGNOSIS — M549 Dorsalgia, unspecified: Secondary | ICD-10-CM | POA: Diagnosis present

## 2015-08-19 DIAGNOSIS — F419 Anxiety disorder, unspecified: Secondary | ICD-10-CM | POA: Insufficient documentation

## 2015-08-19 DIAGNOSIS — E876 Hypokalemia: Secondary | ICD-10-CM | POA: Insufficient documentation

## 2015-08-19 DIAGNOSIS — E782 Mixed hyperlipidemia: Secondary | ICD-10-CM | POA: Insufficient documentation

## 2015-08-19 DIAGNOSIS — M160 Bilateral primary osteoarthritis of hip: Secondary | ICD-10-CM | POA: Insufficient documentation

## 2015-08-19 DIAGNOSIS — R51 Headache: Secondary | ICD-10-CM | POA: Diagnosis not present

## 2015-08-19 DIAGNOSIS — M4806 Spinal stenosis, lumbar region: Secondary | ICD-10-CM | POA: Insufficient documentation

## 2015-08-19 DIAGNOSIS — Z780 Asymptomatic menopausal state: Secondary | ICD-10-CM | POA: Insufficient documentation

## 2015-08-19 DIAGNOSIS — M2242 Chondromalacia patellae, left knee: Secondary | ICD-10-CM | POA: Insufficient documentation

## 2015-08-19 DIAGNOSIS — M7732 Calcaneal spur, left foot: Secondary | ICD-10-CM | POA: Insufficient documentation

## 2015-08-19 DIAGNOSIS — M170 Bilateral primary osteoarthritis of knee: Secondary | ICD-10-CM | POA: Insufficient documentation

## 2015-08-19 MED ORDER — TRIAMCINOLONE ACETONIDE 40 MG/ML IJ SUSP
INTRAMUSCULAR | Status: AC
  Administered 2015-08-19: 12:00:00
  Filled 2015-08-19: qty 1

## 2015-08-19 MED ORDER — TRIAMCINOLONE ACETONIDE 40 MG/ML IJ SUSP
40.0000 mg | Freq: Once | INTRAMUSCULAR | Status: DC
  Filled 2015-08-19: qty 1

## 2015-08-19 MED ORDER — ROPIVACAINE HCL 2 MG/ML IJ SOLN
INTRAMUSCULAR | Status: AC
  Administered 2015-08-19: 12:00:00
  Filled 2015-08-19: qty 10

## 2015-08-19 MED ORDER — ROPIVACAINE HCL 2 MG/ML IJ SOLN
9.0000 mL | Freq: Once | INTRAMUSCULAR | Status: DC
  Filled 2015-08-19: qty 10

## 2015-08-19 MED ORDER — MIDAZOLAM HCL 5 MG/5ML IJ SOLN
1.0000 mg | INTRAMUSCULAR | Status: DC | PRN
  Filled 2015-08-19: qty 5

## 2015-08-19 MED ORDER — LIDOCAINE HCL (PF) 1 % IJ SOLN
10.0000 mL | Freq: Once | INTRAMUSCULAR | Status: DC
  Filled 2015-08-19: qty 10

## 2015-08-19 MED ORDER — FENTANYL CITRATE (PF) 100 MCG/2ML IJ SOLN
25.0000 ug | INTRAMUSCULAR | Status: DC | PRN
Start: 2015-08-19 — End: 2015-09-02
  Filled 2015-08-19: qty 2

## 2015-08-19 MED ORDER — LACTATED RINGERS IV SOLN: 1000.0000 mL | Freq: Once | INTRAVENOUS | Status: DC

## 2015-08-19 NOTE — Progress Notes (Signed)
Patient's Name: Molly Rogers  Patient type: Established  MRN: [id number]  Service setting: Ambulatory outpatient  DOB: [birth year]  Location: [facility]  DOS: 08/19/2015  Primary Care Physician: Christie Nottingham., PA  Note by: Kathlen Brunswick. Dossie Arbour, M.D, DABA, DABAPM, DABPM, DABIPP, FIPP  Referring Physician: Christie Nottingham, [place]  Specialty: Board-Certified Interventional Pain Management  Last Visit to Pain Management: 08/15/2015   Primary Reason(s) for Visit: Interventional Pain Management Treatment. CC: Back Pain  Primary Diagnosis: Facet syndrome, lumbar [M54.5]   Procedure:  Anesthesia, Analgesia, Anxiolysis:  Type: Diagnostic Medial Branch Facet Block Region: Lumbar Level: L2, L3, L4, L5, & S1 Medial Branch Level(s) Laterality: Bilateral  Indications: 1. Lumbar facet syndrome   2. Lumbar spondylosis, unspecified spinal osteoarthritis   3. Lumbar facet arthropathy   4. Chronic low back pain (Location of Primary Source of Pain) (Bilateral) (Midline) (R>L)     Pre-procedure Pain Score: 2/10 Reported level of pain is compatible with clinical observations Post-procedure Pain Score: 1   Type: Moderate (Conscious) Sedation & Local Anesthesia Local Anesthetic: Lidocaine 1% Route: Intravenous (IV) IV Access: Secured Sedation: Meaningful verbal contact was maintained at all times during the procedure  Indication(s): Analgesia & Anxiolysis   Pre-Procedure Assessment:  Ms. Molly Rogers is a 55 y.o. year old, female patient, seen today for interventional treatment. She has SOB (shortness of breath); Atrial flutter ([place]); Abdominal discomfort; Essential hypertension; Anxiety; Acquired aphasia; Appendicular ataxia; CCF (congestive cardiac failure) ([place]); Chronic headache; CAFL (chronic airflow limitation) ([place]); Dermatitis, eczematoid; Benign essential tremor; BP (high blood pressure); ILD (interstitial lung disease) ([place]); Neuritis or radiculitis due to rupture  of lumbar intervertebral disc; Benign neoplasm of meninges ([facility]); MI (mitral incompetence); Neurosis, posttraumatic; Apnea, sleep; Transient cerebral ischemia due to atrial fibrillation ([place]); Has a tremor; Chronic trochanteric bursitis; Fibromyalgia; Atrial fibrillation ([place]); Abnormal uterine bleeding; Congestive heart failure ([place]); Chronic obstructive pulmonary disease ([place]); Interstitial lung disease ([place]); Neoplasm of meninges ([place]); Spasm; Female climacteric state; Sacrococcygeal disorders, not elsewhere classified; Female genuine stress incontinence; Breathlessness on exertion; Paroxysmal atrial fibrillation ([place]); Obstructive apnea; Combined fat and carbohydrate induced hyperlipemia; Benign essential HTN; Chronic pain; Long term current use of opiate analgesic; Lumbar spondylosis; Chronic lower extremity pain (Location of Tertiary source of pain) (Bilateral) (L>R); Chronic low back pain (Location of Primary Source of Pain) (Bilateral) (Midline) (R>L); Chronic hip pain (Location of Secondary source of pain) (Bilateral) (R>L); Osteoarthritis of hips (Location of Secondary source of pain) (Bilateral) (R>L); Chronic knee pain (Bilateral) (L>R); Chronic neck pain (posterior and central); 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 Her primarily concern today is the Back Pain   Pain Type: Chronic pain Pain Location: Back Pain Orientation: Lower Pain  Descriptors / Indicators: Aching, Constant, Radiating Pain Frequency: Constant  Date of Last Visit: 08/11/15 Service Provided on Last Visit: Med Refill  Verification of the correct person, correct site (including marking of site), and correct procedure were performed and confirmed by the patient.  Consent: Secured. Under the influence of no sedatives a written informed consent was obtained, after having provided  information on the risks and possible complications. To fulfill our ethical and legal obligations, as recommended by the American Medical Association's Code of Ethics, we have provided information to the patient about our clinical impression; the nature and purpose of the treatment or procedure; the risks, benefits, and possible complications of the intervention; alternatives; the risk(s) and benefit(s) of the alternative treatment(s) or procedure(s); and the risk(s) and benefit(s) of doing nothing. The patient was provided information about the risks and possible complications associated with the procedure. These include, but are not limited to, failure to achieve desired goals, infection, bleeding, organ or nerve damage, allergic reactions, paralysis, and death. In the case of spinal procedures these may include, but are not limited to, failure to achieve desired goals, infection, bleeding, organ or nerve damage, allergic reactions, paralysis, and death. In addition, the patient was informed that Medicine is not an exact science; therefore, there is also the possibility of unforeseen risks and possible complications that may result in a catastrophic outcome. The patient indicated having understood very clearly. We have given the patient no guarantees and we have made no promises. Enough time was given to the patient to ask questions, all of which were answered to the patient's satisfaction.  Consent Attestation: I, the ordering provider, attest that I have discussed with the patient the  benefits, risks, side-effects, alternatives, likelihood of achieving goals, and potential problems during recovery for the procedure that I have provided informed consent.  Pre-Procedure Preparation: Safety Precautions: Allergies reviewed. Appropriate site, procedure, and patient were confirmed by following the Joint Commission's Universal Protocol (UP.01.01.01), in the form of a "Time Out". The patient was asked to confirm marked site and procedure, before commencing. The patient was asked about blood thinners, or active infections, both of which were denied. Patient was assessed for positional comfort and all pressure points were checked before starting procedure. Allergies: She is allergic to amitriptyline; morphine and related; naproxen; and prednisone.. Infection Control Precautions: Sterile technique used. Standard Universal Precautions were taken as recommended by the Department of [facility] for Disease Control and Prevention (CDC). Standard pre-surgical skin prep was conducted. Respiratory hygiene and cough etiquette was practiced. Hand hygiene observed. Safe injection practices and needle disposal techniques followed. SDV (single dose vial) medications used. Medications properly checked for expiration dates and contaminants. Personal protective equipment (PPE) used: Sterile Radiation-resistant gloves. Monitoring:  As per clinic protocol. Filed Vitals:   08/19/15 1200 08/19/15 1210 08/19/15 1220 08/19/15 1229  BP: 104/49 108/97 109/73 108/77  Pulse: 73 69 74 65  Temp:  97.9 F (36.6 C)  97.3 F (36.3 C)  TempSrc:      Resp: 13 14 12 10   Height:      Weight:      SpO2: 97% 92% 93% 95%  Calculated BMI: Body mass index is 38.98 kg/(m^2).  Description of Procedure Process:   Time-out: "Time-out" completed before starting procedure, as per protocol. Position: Prone Target Area: For Lumbar Facet blocks, the target is the groove formed by the junction of the transverse process  and superior articular process. For the L5 dorsal ramus, the target is the notch between superior articular process and sacral ala. For the S1 dorsal ramus, the target is the superior and lateral edge of the posterior S1 Sacral foramen. Approach: Paramedial approach. Area Prepped: Entire Posterior Lumbosacral Region Prepping solution: ChloraPrep (2% chlorhexidine gluconate and 70% isopropyl alcohol) Safety Precautions: Aspiration looking for blood return was conducted prior to all injections. At no point did  we inject any substances, as a needle was being advanced. No attempts were made at seeking any paresthesias. Safe injection practices and needle disposal techniques used. Medications properly checked for expiration dates. SDV (single dose vial) medications used.   Description of the Procedure: Protocol guidelines were followed. The patient was placed in position over the fluoroscopy table. The target area was identified and the area prepped in the usual manner. Skin desensitized using vapocoolant spray. Skin & deeper tissues infiltrated with local anesthetic. Appropriate amount of time allowed to pass for local anesthetics to take effect. The procedure needle was introduced through the skin, ipsilateral to the reported pain, and advanced to the target area. Employing the "Medial Branch Technique", the needles were advanced to the angle made by the superior and medial portion of the transverse process, and the lateral and inferior portion of the superior articulating process of the targeted vertebral bodies. This area is known as "Burton's Eye" or the "Eye of the [place] Dog". A procedure needle was introduced through the skin, and this time advanced to the angle made by the superior and medial border of the sacral ala, and the lateral border of the S1 vertebral body. This last needle was later repositioned at the superior and lateral border of the posterior S1 foramen. Negative aspiration confirmed.  Solution injected in intermittent fashion, asking for systemic symptoms every 0.5cc of injectate. The needles were then removed and the area cleansed, making sure to leave some of the prepping solution back to take advantage of its long term bactericidal properties. EBL: None Materials & Medications Used:  Needle(s) Used: 22g - 3.5" Spinal Needle(s)  Imaging Guidance:   Type of Imaging Technique: Fluoroscopy Guidance (Spinal) Indication(s): Assistance in needle guidance and placement for procedures requiring needle placement in or near specific anatomical locations not easily accessible without such assistance. Exposure Time: Please see nurses notes. Contrast: None required. Fluoroscopic Guidance: I was personally present in the fluoroscopy suite, where the patient was placed in position for the procedure, over the fluoroscopy-compatible table. Fluoroscopy was manipulated, using "Tunnel Vision Technique", to obtain the best possible view of the target area, on the affected side. Parallax error was corrected before commencing the procedure. A "direction-depth-direction" technique was used to introduce the needle under continuous pulsed fluoroscopic guidance. Once the target was reached, antero-posterior, oblique, and lateral fluoroscopic projection views were taken to confirm needle placement in all planes. Permanently recorded images stored by scanning into EMR. Interpretation: Intraoperative imaging interpretation by performing Physician. Adequate needle placement confirmed. Adequate needle placement confirmed in AP, lateral, & Oblique Views. No contrast injected.  Antibiotic Prophylaxis:  Indication(s): No indications identified. Type:  Antibiotics Given (last 72 hours)    None       Post-operative Assessment:   Complications: No immediate post-treatment complications were observed. Disposition: Return to clinic for follow-up evaluation. The patient tolerated the entire procedure well. A  repeat set of vitals were taken after the procedure and the patient was kept under observation following institutional policy, for this procedure. Post-procedural neurological assessment was performed, showing return to baseline, prior to discharge. The patient was discharged home, once institutional criteria were met. The patient was provided with post-procedure discharge instructions, including a section on how to identify potential problems. Should any problems arise concerning this procedure, the patient was given instructions to immediately contact us, at any time, without hesitation. In any case, we plan to contact the patient by telephone for a follow-up status report regarding this interventional procedure. Comments:  No additional relevant information.  Medications administered during this visit: We administered ropivacaine (PF) 2 mg/ml (0.2%) and triamcinolone acetonide.  Prescriptions ordered during this visit: New Prescriptions   No medications on file    Requested PM Follow-up: Return for Post-Procedure Eval (2 weeks).  Future Appointments Date Time Provider [place]  09/02/2015 9:20 AM Milinda Pointer, MD [facility] None    Primary Care Physician: Christie Nottingham., PA Location: [facility] Note by: Kathlen Brunswick Dossie Arbour, M.D, DABA, DABAPM, DABPM, DABIPP, FIPP   Illustration of the posterior view of the lumbar spine and the posterior neural structures. Laminae of L2 through S1 are labeled. DPRL5, dorsal primary ramus of L5; DPRS1, dorsal primary ramus of S1; DPR3, dorsal primary ramus of L3; FJ, facet (zygapophyseal) joint L3-L4; I, inferior articular process of L4; LB1, lateral branch of dorsal primary ramus of L1; IAB, inferior articular branches from L3 medial branch (supplies L4-L5 facet joint); IBP, intermediate branch plexus; MB3, medial branch of dorsal primary ramus of L3; NR3, third lumbar nerve root; S, superior articular process of  L5; SAB, superior articular branches from L4 (supplies L4-5 facet joint also); TP3, transverse process of L3.  Disclaimer:  Medicine is not an Chief Strategy Officer. The only guarantee in medicine is that nothing is guaranteed. It is important to note that the decision to proceed with this intervention was based on the information collected from the patient. The Data and conclusions were drawn from the patient's questionnaire, the interview, and the physical examination. Because the information was provided in large part by the patient, it cannot be guaranteed that it has not been purposely or unconsciously manipulated. Every effort has been made to obtain as much relevant data as possible for this evaluation. It is important to note that the conclusions that lead to this procedure are derived in large part from the available data. Always take into account that the treatment will also be dependent on availability of resources and existing treatment guidelines, considered by other Pain Management Practitioners as being common knowledge and practice, at the time of the intervention. For Medico-Legal purposes, it is also important to point out that variation in procedural techniques and pharmacological choices are the acceptable norm. The indications, contraindications, technique, and results of the above procedure should only be interpreted and judged by a Board-Certified Interventional Pain Specialist with extensive familiarity and expertise in the same exact procedure and technique. Attempts at providing opinions without similar or greater experience and expertise than that of the treating physician will be considered as inappropriate and unethical, and shall result in a formal complaint to the state medical board and applicable specialty societies.

## 2015-08-19 NOTE — Patient Instructions (Signed)

## 2015-08-20 ENCOUNTER — Telehealth: Payer: Self-pay | Admitting: *Deleted

## 2015-08-20 NOTE — Telephone Encounter (Signed)
Message left

## 2015-09-01 ENCOUNTER — Telehealth: Payer: Self-pay | Admitting: *Deleted

## 2015-09-01 NOTE — Telephone Encounter (Signed)
lvm made pt aware that I was returning her call...td

## 2015-09-02 ENCOUNTER — Ambulatory Visit: Payer: Medicaid Other | Attending: Pain Medicine | Admitting: Pain Medicine

## 2015-09-02 ENCOUNTER — Encounter: Payer: Self-pay | Admitting: Pain Medicine

## 2015-09-02 VITALS — BP 132/78 | HR 73 | Temp 98.6°F | Resp 16 | Ht 63.0 in | Wt 220.0 lb

## 2015-09-02 DIAGNOSIS — D329 Benign neoplasm of meninges, unspecified: Secondary | ICD-10-CM | POA: Insufficient documentation

## 2015-09-02 DIAGNOSIS — M47812 Spondylosis without myelopathy or radiculopathy, cervical region: Secondary | ICD-10-CM | POA: Diagnosis not present

## 2015-09-02 DIAGNOSIS — R270 Ataxia, unspecified: Secondary | ICD-10-CM | POA: Insufficient documentation

## 2015-09-02 DIAGNOSIS — F17210 Nicotine dependence, cigarettes, uncomplicated: Secondary | ICD-10-CM | POA: Insufficient documentation

## 2015-09-02 DIAGNOSIS — R0602 Shortness of breath: Secondary | ICD-10-CM | POA: Insufficient documentation

## 2015-09-02 DIAGNOSIS — I4891 Unspecified atrial fibrillation: Secondary | ICD-10-CM | POA: Diagnosis not present

## 2015-09-02 DIAGNOSIS — F1190 Opioid use, unspecified, uncomplicated: Secondary | ICD-10-CM | POA: Diagnosis not present

## 2015-09-02 DIAGNOSIS — M47897 Other spondylosis, lumbosacral region: Secondary | ICD-10-CM | POA: Diagnosis not present

## 2015-09-02 DIAGNOSIS — G20 Parkinson's disease: Secondary | ICD-10-CM | POA: Diagnosis not present

## 2015-09-02 DIAGNOSIS — I340 Nonrheumatic mitral (valve) insufficiency: Secondary | ICD-10-CM | POA: Diagnosis not present

## 2015-09-02 DIAGNOSIS — Z6841 Body Mass Index (BMI) 40.0 and over, adult: Secondary | ICD-10-CM | POA: Diagnosis not present

## 2015-09-02 DIAGNOSIS — L309 Dermatitis, unspecified: Secondary | ICD-10-CM | POA: Insufficient documentation

## 2015-09-02 DIAGNOSIS — R51 Headache: Secondary | ICD-10-CM | POA: Insufficient documentation

## 2015-09-02 DIAGNOSIS — M797 Fibromyalgia: Secondary | ICD-10-CM | POA: Diagnosis not present

## 2015-09-02 DIAGNOSIS — M25552 Pain in left hip: Secondary | ICD-10-CM | POA: Insufficient documentation

## 2015-09-02 DIAGNOSIS — M5126 Other intervertebral disc displacement, lumbar region: Secondary | ICD-10-CM | POA: Diagnosis not present

## 2015-09-02 DIAGNOSIS — J849 Interstitial pulmonary disease, unspecified: Secondary | ICD-10-CM | POA: Diagnosis not present

## 2015-09-02 DIAGNOSIS — M549 Dorsalgia, unspecified: Secondary | ICD-10-CM | POA: Diagnosis present

## 2015-09-02 DIAGNOSIS — M4806 Spinal stenosis, lumbar region: Secondary | ICD-10-CM | POA: Insufficient documentation

## 2015-09-02 DIAGNOSIS — R109 Unspecified abdominal pain: Secondary | ICD-10-CM | POA: Insufficient documentation

## 2015-09-02 DIAGNOSIS — M25551 Pain in right hip: Secondary | ICD-10-CM | POA: Diagnosis not present

## 2015-09-02 DIAGNOSIS — G459 Transient cerebral ischemic attack, unspecified: Secondary | ICD-10-CM | POA: Insufficient documentation

## 2015-09-02 DIAGNOSIS — I4892 Unspecified atrial flutter: Secondary | ICD-10-CM | POA: Diagnosis not present

## 2015-09-02 DIAGNOSIS — I10 Essential (primary) hypertension: Secondary | ICD-10-CM | POA: Diagnosis not present

## 2015-09-02 DIAGNOSIS — M1712 Unilateral primary osteoarthritis, left knee: Secondary | ICD-10-CM | POA: Insufficient documentation

## 2015-09-02 DIAGNOSIS — I509 Heart failure, unspecified: Secondary | ICD-10-CM | POA: Diagnosis not present

## 2015-09-02 DIAGNOSIS — G8929 Other chronic pain: Secondary | ICD-10-CM | POA: Diagnosis not present

## 2015-09-02 DIAGNOSIS — M2242 Chondromalacia patellae, left knee: Secondary | ICD-10-CM | POA: Diagnosis not present

## 2015-09-02 DIAGNOSIS — F419 Anxiety disorder, unspecified: Secondary | ICD-10-CM | POA: Diagnosis not present

## 2015-09-02 DIAGNOSIS — E782 Mixed hyperlipidemia: Secondary | ICD-10-CM | POA: Insufficient documentation

## 2015-09-02 DIAGNOSIS — M533 Sacrococcygeal disorders, not elsewhere classified: Secondary | ICD-10-CM | POA: Insufficient documentation

## 2015-09-02 DIAGNOSIS — M545 Low back pain: Secondary | ICD-10-CM

## 2015-09-02 DIAGNOSIS — Z5181 Encounter for therapeutic drug level monitoring: Secondary | ICD-10-CM

## 2015-09-02 DIAGNOSIS — M4802 Spinal stenosis, cervical region: Secondary | ICD-10-CM | POA: Insufficient documentation

## 2015-09-02 DIAGNOSIS — R4701 Aphasia: Secondary | ICD-10-CM | POA: Diagnosis not present

## 2015-09-02 DIAGNOSIS — J449 Chronic obstructive pulmonary disease, unspecified: Secondary | ICD-10-CM | POA: Insufficient documentation

## 2015-09-02 DIAGNOSIS — N939 Abnormal uterine and vaginal bleeding, unspecified: Secondary | ICD-10-CM | POA: Diagnosis not present

## 2015-09-02 DIAGNOSIS — Z79891 Long term (current) use of opiate analgesic: Secondary | ICD-10-CM | POA: Diagnosis not present

## 2015-09-02 DIAGNOSIS — G250 Essential tremor: Secondary | ICD-10-CM | POA: Insufficient documentation

## 2015-09-02 DIAGNOSIS — M47816 Spondylosis without myelopathy or radiculopathy, lumbar region: Secondary | ICD-10-CM | POA: Insufficient documentation

## 2015-09-02 DIAGNOSIS — G4730 Sleep apnea, unspecified: Secondary | ICD-10-CM | POA: Diagnosis not present

## 2015-09-02 DIAGNOSIS — M25559 Pain in unspecified hip: Secondary | ICD-10-CM | POA: Diagnosis present

## 2015-09-02 DIAGNOSIS — E876 Hypokalemia: Secondary | ICD-10-CM | POA: Diagnosis not present

## 2015-09-02 MED ORDER — OXYCODONE HCL 10 MG PO TABS: 10.0000 mg | ORAL_TABLET | Freq: Four times a day (QID) | ORAL | Status: DC | PRN

## 2015-09-02 NOTE — Progress Notes (Signed)
Patient's Name: Molly Rogers  Patient type: Established  MRN: [id number]  Service setting: Ambulatory outpatient  DOB: 27-Jan-1961  Location: [facility]  DOS: 09/02/2015  Primary Care Physician: Molly Rogers., PA  Note by: Molly Rogers. Molly Rogers, M.D, DABA, DABAPM, DABPM, DABIPP, FIPP  Referring Physician: Christie Nottingham, PA  Specialty: Board-Certified Interventional Pain Management  Last Visit to Pain Management: 09/01/2015   Primary Reason(s) for Visit: Encounter for prescription drug management & post-procedure evaluation of chronic illness with mild to moderate exacerbation(Level of risk: moderate) CC: Back Pain and Hip Pain   HPI  Ms. Molly Rogers is a 55 y.o. year old, female patient, who returns today as an established patient. She has SOB (shortness of breath); Atrial flutter ([place]); Abdominal discomfort; Essential hypertension; Anxiety; Acquired aphasia; Appendicular ataxia; CCF (congestive cardiac failure) ([place]); Chronic headache; CAFL (chronic airflow limitation) ([place]); Dermatitis, eczematoid; Benign essential tremor; BP (high blood pressure); ILD (interstitial lung disease) ([place]); Neuritis or radiculitis due to rupture of lumbar intervertebral disc; Benign neoplasm of meninges (HCC); MI (mitral incompetence); Neurosis, posttraumatic; Apnea, sleep; Transient cerebral ischemia due to atrial fibrillation ([place]); Has a tremor; Chronic trochanteric bursitis; Fibromyalgia; Atrial fibrillation ([place]); Abnormal uterine bleeding; Congestive heart failure ([place]); Chronic obstructive pulmonary disease ([place]); Interstitial lung disease ([place]); Neoplasm of meninges ([place]); Spasm; Female climacteric state; Sacrococcygeal disorders, not elsewhere classified; Female genuine stress incontinence; Breathlessness on exertion; Paroxysmal atrial fibrillation ([place]); Obstructive apnea; Combined fat and carbohydrate induced hyperlipemia; Benign essential HTN; Chronic pain; Long term current  use of opiate analgesic; Lumbar spondylosis; Chronic lower extremity pain (Location of Tertiary source of pain) (Bilateral) (L>R); Chronic low back pain (Location of Primary Source of Pain) (Bilateral) (Midline) (R>L); Chronic hip pain (Location of Secondary source of pain) (Bilateral) (R>L); Osteoarthritis of hips (Location of Secondary source of pain) (Bilateral) (R>L); Chronic knee pain (Bilateral) (L>R); Chronic neck pain (posterior and central); 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 Her primarily concern today is the Back Pain and Hip Pain   Pain Assessment: Self-Reported Pain Score: 3  Reported level is compatible with observation Pain Type: Chronic pain Pain Location: Back Pain Orientation: Lower Pain Descriptors / Indicators: Throbbing, Sharp, Aching Pain Frequency: Intermittent  The patient comes into the clinics today for post-procedure evaluation on the interventional treatment done on 08/19/2015. In addition, she comes in today for pharmacological management of her chronic pain.  The patient  reports that she does not use illicit drugs.  Date of Last Visit: 08/19/15 Service Provided on Last Visit: Procedure  Controlled Substance Pharmacotherapy Assessment & REMS (Risk Evaluation and Mitigation Strategy)  Analgesic: Oxycodone IR 10 mg every 6 hours (40 mg/day of oxycodone) Pill Count: The patient forgot to bring her pills  and bottles to the appointment. She has been reminded that this is necessary. MME/day: 60 mg/day.  Pharmacokinetics: Onset of action (Liberation/Absorption): Within expected pharmacological parameters Time to Peak effect (Distribution): Timing and results are as within normal expected parameters Duration of action (Metabolism/Excretion): Within normal limits for medication Pharmacodynamics: Analgesic Effect: More than 50% Activity Facilitation:  Medication(s) allow patient to sit, stand, walk, and do the basic ADLs Perceived Effectiveness: Described as relatively effective, allowing for increase in activities of daily living (ADL) Side-effects or Adverse reactions: None reported Monitoring: [place] PMP: Online review of the past 34-month period conducted. Compliant with practice rules and regulations Last UDS on record: TOXASSURE SELECT 13  Date Value Ref Range Status  06/09/2015 FINAL  Final    Comment:    ==================================================================== TOXASSURE SELECT 13 (MW) ==================================================================== Test                             Result       Flag       Units Drug Present and Declared for Prescription Verification   Alprazolam                     358          EXPECTED   ng/mg creat   Alpha-hydroxyalprazolam        287          EXPECTED   ng/mg creat    Source of alprazolam is a scheduled prescription medication.    Alpha-hydroxyalprazolam is an expected metabolite of alprazolam. ==================================================================== Test                      Result    Flag   Units      Ref Range   Creatinine              31               mg/dL      >=20 ==================================================================== Declared Medications:  The flagging and interpretation on this report are based on the  following declared medications.  Unexpected results may arise from  inaccuracies in the declared  medications.  **Note: The testing scope of this panel includes these medications:  Alprazolam (Xanax)  **Note: The testing scope of this panel does not include following  reported medications:  Albuterol (Proventil)  Bupropion (Wellbutrin)  Cetirizine (Zyrtec)  Citalopram (Lexapro)  Cyclobenzaprine (Flexeril)  Fluticasone (Advair)  Furosemide (Lasix)  Gabapentin (Neurontin)  Hydrochlorothiazide (Hyzaar)  Losartan (Hyzaar)  Lubiprostone (Amitiza)  Omeprazole (Prilosec)  Pregabalin (Lyrica)  Salmeterol (Advair)  Solifenacin (Vesicare)  Tiotropium (Spiriva) ==================================================================== For clinical consultation, please call [phone]. ====================================================================    UDS interpretation: Compliant Medication Assessment Form: Reviewed. Patient indicates being compliant with therapy Treatment compliance: Compliant Risk Assessment: Aberrant Behavior: None observed today Substance Use Disorder (SUD) Risk Level: Low Risk of opioid abuse or dependence: 0.7-3.0% with doses ? 36 MME/day and 6.1-26% with doses ? 120 MME/day. Opioid Risk Tool (ORT) Score:  4 Moderate Risk for SUD (Score between 4-7) Depression Scale Score: PHQ-2: PHQ-2 Total Score: 0 No depression (0) PHQ-9: PHQ-9 Total Score: 0 No depression (0-4)  Pharmacologic Plan: No change in therapy, at this time  Post-Procedure Assessment  Procedure done on last visit: Diagnostic bilateral lumbar facet block under fluoroscopic guidance and IV sedation #1. Side-effects or Adverse reactions: None reported Sedation: Sedation given  Results: Ultra-Short Term Relief (First 1 hour after procedure): 100 %  Analgesia during this period is likely to be Local Anesthetic and/or IV Sedative (Analgesic/Anxiolitic) related Short Term Relief (Initial 4-6 hrs after procedure): 50 % Complete relief confirms area to be the source of pain Long Term Relief : 25  % Long-term benefit would suggest an inflammatory etiology to the pain  Current Relief (Now): 25%  Recurrance of pain could suggest persistent aggravating factors Interpretation of Results: The results of this diagnostic procedure would suggest that the facet joints are definitely involved in the etiology of her pain. We will go ahead and plan on doing a second diagnostic injection and if successful, we will consider radiofrequency ablation.  Laboratory Chemistry  Inflammation Markers Lab Results  Component Value Date   ESRSEDRATE 14 08/08/2015   CRP <0.5 08/08/2015    Renal Function Lab Results  Component Value Date   BUN 13 08/08/2015   CREATININE 0.86 08/08/2015   GFRAA >60 08/08/2015   GFRNONAA >60 08/08/2015    Hepatic Function Lab Results  Component Value Date   AST 20 08/08/2015   ALT 17 08/08/2015   ALBUMIN 3.9 08/08/2015    Electrolytes Lab Results  Component Value Date   NA 139 08/08/2015   K 3.7 08/08/2015   CL 103 08/08/2015   CALCIUM 9.1 08/08/2015   MG 2.1 08/08/2015    Pain Modulating Vitamins Lab Results  Component Value Date   VITAMINB12 385 08/08/2015    Coagulation Parameters Lab Results  Component Value Date   PLT 291 06/09/2015    Note: Labs Reviewed.  Recent Diagnostic Imaging  Cervical Imaging: Cervical MR wo contrast:  Results for orders placed in visit on 06/13/13  MR C Spine Ltd W/O Cm   Narrative * PRIOR REPORT IMPORTED FROM AN EXTERNAL SYSTEM *   CLINICAL DATA:  Left-sided neck pain.   EXAM:  MRI CERVICAL SPINE WITHOUT CONTRAST   TECHNIQUE:  Multiplanar, multisequence MR imaging was performed. No intravenous  contrast was administered.   COMPARISON:  None.   FINDINGS:  The visualized intracranial contents and cervical spinal cord are  normal. Paraspinal soft tissues are normal except for multiple  cystic lesions in both lobes of the thyroid gland. The finding is  consistent with a multinodular goiter. There is a  dominant  inhomogeneous 2.6 cm nodule in the right lower pole. If this has not  been previously assessed, I recommend evaluation with thyroid  ultrasound.   C1-2 and C2-3: Tiny disc bulge in uncinate spurs extend into the  proximal left neural foramen with out foraminal stenosis or focal  neural impingement.   C3-4:  Normal.   C4-5: Small uncinate spurs and disc bulge into the left neural  foramen without foraminal stenosis.   C5-6:  Very tiny central disc bulge with no neural impingement.   C6-7:  Tiny central disc bulge with no neural impingement.   C7-T1 and T1-2:  Normal.   There is no facet arthritis in the cervical spine.   IMPRESSION:  1. Diffuse slight degenerative disc disease without neural  impingement.  2. Complex 2.6 cm lesion in the lower pole the right lobe of the  thyroid gland. Ultrasound recommended for further evaluation if not  previously assessed.    Electronically Signed    By: Rozetta Nunnery M.D.    On: 06/13/2013 15:25       Lumbosacral Imaging: Lumbar MR wo contrast:  Results for orders placed during the hospital encounter of 07/03/15  MR Lumbar Spine Wo Contrast   Narrative CLINICAL DATA:  Low back pain with LEFT leg pain extending to the foot. Numbness in the upper leg anteriorly.  EXAM: MRI LUMBAR SPINE WITHOUT CONTRAST  TECHNIQUE: Multiplanar, multisequence MR imaging of the lumbar spine was performed. No intravenous contrast was administered.  COMPARISON:  MRI lumbar spine 06/13/2013.  FINDINGS: Segmentation:  Normal.  Alignment:  Normal.  Vertebrae: No worrisome osseous lesion.  Conus medullaris: Normal in size, signal, and location.  Paraspinal tissues: No evidence for hydronephrosis or paravertebral mass.  Disc levels:  L1-L2:  Normal disc space.  L2-L3:  Normal disc space.  Mild facet arthropathy.  No impingement.  L3-L4: Normal disc space. Mild to moderate facet arthropathy and ligamentum flavum hypertrophy. No  subarticular zone or foraminal zone narrowing.  L4-L5: Trace anterolisthesis. Moderate facet arthropathy and ligamentum flavum hypertrophy. Annular bulging. Moderate to severe stenosis. LEFT greater than RIGHT L5 nerve root impingement. Mild BILATERAL foraminal narrowing does not clearly affect the L4 nerve roots.  L5-S1: Mild disc space narrowing. Central protrusion. Posterior element hypertrophy. No definite subarticular zone narrowing. Mild foraminal narrowing could affect either L5 nerve root.  Compared with priors, there is slight worsening since 2015.  IMPRESSION: The dominant abnormality is at L4-5, where moderate to severe stenosis of a multifactorial nature relates to annular bulging, trace facet mediated slip, and moderate posterior element hypertrophy, affecting the LEFT greater than RIGHT L5 nerve roots. See comments above.  BILATERAL foraminal narrowing at L5-S1 could contribute to the LEFT leg symptoms. Correlate clinically.   Electronically Signed   By: Staci Righter M.D.   On: 07/03/2015 13:32    Lumbar MR wo contrast:  Results for orders placed in visit on 06/13/13  MR L Spine Ltd W/O Cm   Narrative * PRIOR REPORT IMPORTED FROM AN EXTERNAL SYSTEM *   CLINICAL DATA:  Low back pain   EXAM:  MRI LUMBAR SPINE WITHOUT CONTRAST   TECHNIQUE:  Multiplanar, multisequence MR imaging was performed. No intravenous  contrast was administered.   COMPARISON:  None.   FINDINGS:  The vertebral bodies of the lumbar spine are normal in size and  alignment. There is normal bone marrow signal demonstrated  throughout the vertebra. The intervertebral disc spaces are  well-maintained.   The spinal cord is normal in signal and contour. The cord terminates  normally at L1 . The nerve roots of the cauda equina and the filum  terminale are normal.   The visualized portions of the SI joints are unremarkable.   The imaged intra-abdominal contents are unremarkable.    T12-L1: No significant disc bulge. No evidence of neural foraminal  stenosis. No central canal stenosis.   L1-L2: No significant disc bulge. No evidence of neural foraminal  stenosis. No central canal stenosis.   L2-L3: No significant disc bulge. No evidence of neural foraminal  stenosis. No central canal stenosis.   L3-L4: No significant disc bulge. No evidence of neural foraminal  stenosis. No central canal stenosis.   L4-L5: Mild broad-based disc bulge. Moderate bilateral facet  arthropathy with ligamentum flavum infolding resulting in mild  spinal stenosis. Mild bilateral foraminal stenosis.   L5-S1: Mild broad-based disc bulge. Mild right facet arthropathy.  Moderate bilateral foraminal stenosis. No central canal stenosis.   IMPRESSION:  1. At L4-5 there is a mild broad-based disc bulge with moderate  bilateral facet arthropathy and ligamentum flavum infolding  resulting in mild spinal stenosis.  2. At L5-S1 there is a mild broad-based disc bulge. Moderate  bilateral foraminal stenosis.    Electronically Signed    By: Kathreen Devoid    On: 06/13/2013 15:33       Knee Imaging: Knee-L MR w contrast:  Results for orders placed in visit on 11/16/13  MR Knee Left  Wo Contrast   Narrative * PRIOR REPORT IMPORTED FROM AN  EXTERNAL SYSTEM *   CLINICAL DATA:  Diffuse progressive left knee pain with swelling.   EXAM:  MRI OF THE LEFT KNEE WITHOUT CONTRAST   TECHNIQUE:  Multiplanar, multisequence MR imaging of the knee was performed. No  intravenous contrast was administered.   COMPARISON:  MRI dated 11/09/2012   FINDINGS:  MENISCI   Medial meniscus:  Intact.   Lateral meniscus:  Intact.   LIGAMENTS   Cruciates:  Normal.   Collaterals:  Normal.   CARTILAGE   Patellofemoral: Almost complete denuding of the cartilage of the  patella with focal grade 4 chondromalacia of the lateral aspect of  the trochlear groove. This has progressed markedly since the prior   study of 11/09/2012. There is also what appears to be a large  cartilage fragment in the suprapatellar recess measuring 18 x 14 x  2.5 mm.   Medial: Focal 10 mm diameter area of full-thickness cartilage loss  of central portion of the medial femoral condyle.   Lateral:  Normal.   Joint:  Moderate joint effusion.   Popliteal Fossa:  Tiny Baker's cyst.   Extensor Mechanism:  Normal.   Bones: Small marginal osteophytes in the patellofemoral and medial  compartments.   IMPRESSION:  1. Marked progression of extensive chondromalacia in the  patellofemoral compartment.  2. Possible large cartilaginous fragment in the suprapatellar  recess.  3. Full-thickness cartilage loss in the central portion of the  medial femoral condyle. Moderate joint effusion.    Electronically Signed    By: Rozetta Nunnery M.D.    On: 11/16/2013 16:22       Knee-R DG 4 views:  Results for orders placed in visit on 08/08/07  DG Knee Complete 4 Views Right   Narrative * PRIOR REPORT IMPORTED FROM AN EXTERNAL SYSTEM *   PRIOR REPORT IMPORTED FROM THE [facility] WORKFLOW SYSTEM   REASON FOR EXAM:    Degenerative joint disease  COMMENTS:   PROCEDURE:     DXR - DXR KNEE RT COMP WITH OBLIQUES  - Aug 08 2007  8:57AM   RESULT:     Images of the RIGHT knee demonstrate no definite fracture,  dislocation or radiopaque foreign body. Degenerative joint space narrowing  is present especially medially and in the patellofemoral region.   IMPRESSION:      Degenerative changes. No acute bony abnormality evident.   Thank you for this opportunity to contribute to the care of your patient.       Meds  The patient has a current medication list which includes the following prescription(s): albuterol, alprazolam, bupropion, fluticasone-salmeterol, losartan-hydrochlorothiazide, lubiprostone, naloxone hcl, oxycodone hcl, pravastatin, and solifenacin.  Current Outpatient Prescriptions on File Prior to Visit  Medication Sig   . albuterol (PROVENTIL) (2.5 MG/3ML) 0.083% nebulizer solution Take 2.5 mg by nebulization every 6 (six) hours as needed for wheezing.  Marland Kitchen ALPRAZolam (XANAX) 1 MG tablet Take 1 mg by mouth 3 (three) times daily.  Marland Kitchen buPROPion (WELLBUTRIN XL) 150 MG 24 hr tablet Take 300 mg by mouth daily.   . Fluticasone-Salmeterol (ADVAIR DISKUS) 250-50 MCG/DOSE AEPB Inhale 1 puff into the lungs 2 (two) times daily.  Marland Kitchen losartan-hydrochlorothiazide (HYZAAR) 100-25 MG tablet Take 1 tablet by mouth.  . lubiprostone (AMITIZA) 8 MCG capsule Take 1 tablet by mouth 2 (two) times daily.   . Naloxone HCl (NARCAN) 4 MG/0.1ML LIQD Spray into one nostril. Repeat with second device into other nostril after 2-3 minutes if no or minimal response.  . pravastatin (PRAVACHOL) 40  MG tablet Take 40 mg by mouth daily.  . solifenacin (VESICARE) 10 MG tablet Take 1 tablet by mouth daily. Reported on 07/11/2015   No current facility-administered medications on file prior to visit.    ROS  Constitutional: Denies any fever or chills Gastrointestinal: No reported hemesis, hematochezia, vomiting, or acute GI distress Musculoskeletal: Denies any acute onset joint swelling, redness, loss of ROM, or weakness Neurological: No reported episodes of acute onset apraxia, aphasia, dysarthria, agnosia, amnesia, paralysis, loss of coordination, or loss of consciousness  Allergies  Ms. Hollenbach is allergic to amitriptyline; morphine and related; naproxen; and prednisone.  [place]  Medical:  Ms. Staub  has a past medical history of Hypertension; A-fib ([place]); Hyperlipidemia; Asthma; Ruptured appendix; COPD (chronic obstructive pulmonary disease) ([place]); Parkinson's disease (tremor, stiffness, slow motion, unstable posture) ([place]); Brain tumor ([place]); Cancer ([place]); Bursitis; DJD (degenerative joint disease) of hip; Chronic back pain; Fibromyalgia; Apnea, sleep; CHF (congestive heart failure) ([place]); Arthritis of knee, degenerative (08/16/2013); Back pain  at L4-L5 level (12/18/2014); DDD (degenerative disc disease), lumbar (12/18/2014); Degenerative arthritis of lumbar spine (08/20/2013); Derangement of knee (09/28/2013); Hip bursitis (12/18/2014); and Spondylosis of lumbosacral region without myelopathy or radiculopathy (12/18/2014). Family: family history includes Heart disease in her father and mother; Heart failure in her father; Hyperlipidemia in her father; Hypertension in her father. Surgical:  has past surgical history that includes Appendectomy; Carpal tunnel release; and Finger surgery. Tobacco:  reports that she has been smoking Cigarettes.  She has a 17.5 pack-year smoking history. She has never used smokeless tobacco. Alcohol:  reports that she drinks about 0.6 oz of alcohol per week. Drug:  reports that she does not use illicit drugs.  Constitutional Exam  Vitals: Blood pressure 132/78, pulse 73, temperature 98.6 F (37 C), temperature source Oral, resp. rate 16, height 5\' 3"  (1.6 m), weight 220 lb (99.791 kg), SpO2 99 %. General appearance: Well nourished, well developed, and well hydrated. In no acute distress Calculated BMI/Body habitus: Body mass index is 38.98 kg/(m^2). (35-39.9 kg/m2) Severe obesity (Class II) - 136% higher incidence of chronic pain. Patient instructed to bring her weight down to a BMI of 30 (approximately 165 pounds for her height). Psych/Mental status: Alert and oriented x 3 (person, place, & time) Eyes: PERLA Respiratory: No evidence of acute respiratory distress  Cervical Spine Exam  Inspection: No masses, redness, or swelling Alignment: Symmetrical ROM: Functional: ROM is within functional limits [facility]) Stability: No instability detected Muscle strength & Tone: Functionally intact Sensory: Unimpaired Palpation: No complaints of tenderness  Upper Extremity (UE) Exam    Side: Right upper extremity  Side: Left upper extremity  Inspection: No masses, redness, swelling, or asymmetry  Inspection: No masses,  redness, swelling, or asymmetry  ROM:  ROM:  Functional: ROM is within functional limits [facility])  Functional: ROM is within functional limits [facility])  Muscle strength & Tone: Functionally intact  Muscle strength & Tone: Functionally intact  Sensory: Unimpaired  Sensory: Unimpaired  Palpation: Non-contributory  Palpation: Non-contributory   Thoracic Spine Exam  Inspection: No masses, redness, or swelling Alignment: Symmetrical ROM: Functional: ROM is within functional limits [facility]) Stability: No instability detected Sensory: Unimpaired Muscle strength & Tone: Functionally intact Palpation: No complaints of tenderness  Lumbar Spine Exam  Inspection: No masses, redness, or swelling Alignment: Symmetrical ROM: Functional: Decreased ROM Stability: No instability detected Muscle strength & Tone: Functionally intact Sensory: Unimpaired Palpation: Tender Provocative Tests: Lumbar Hyperextension and rotation test: Positive for bilateral lumbar facet pain Patrick's Maneuver: deferred  Gait & Posture Assessment  Ambulation: Patient ambulates using a cane Gait: Antalgic Posture: WNL  Lower Extremity Exam    Side: Right lower extremity  Side: Left lower extremity  Inspection: No masses, redness, swelling, or asymmetry ROM:  Inspection: No masses, redness, swelling, or asymmetry ROM:  Functional: ROM is within functional limits [facility])  Functional: ROM is within functional limits [facility])  Muscle strength & Tone: Functionally intact  Muscle strength & Tone: Functionally intact  Sensory: Unimpaired  Sensory: Unimpaired  Palpation: Non-contributory  Palpation: Non-contributory   Assessment & Plan  Primary Diagnosis & Pertinent Problem List: The primary encounter diagnosis was Chronic pain. Diagnoses of Encounter for therapeutic drug level monitoring, Long term current use of opiate analgesic, Opiate use (60 MME/Day), and Lumbar facet syndrome (Location of Primary Source of Pain) (Bilateral)  (R>L) were also pertinent to this visit.  Visit Diagnosis: 1. Chronic pain   2. Encounter for therapeutic drug level monitoring   3. Long term current use of opiate analgesic   4. Opiate use (60 MME/Day)   5. Lumbar facet syndrome (Location of Primary Source of Pain) (Bilateral) (R>L)     Problems updated and reviewed during this visit: Problem  Lumbar facet syndrome (Location of Primary Source of Pain) (Bilateral) (R>L)    Problem-specific Plan(s): Lumbar facet syndrome (Location of Primary Source of Pain) (Bilateral) (R>L) The patient had a bilateral diagnostic lumbar facet block under fluoroscopic guidance and IV sedation the provided her with more than 50% relief of the pain for the duration of localized static confirming that the lumbar facets are involved in the axial year for pain.    No new assessment & plan notes have been filed under this hospital service since the last note was generated. Service: Pain Management   Plan of Care   Problem List Items Addressed This Visit      High   Chronic pain - Primary (Chronic)   Relevant Medications   Oxycodone HCl 10 MG TABS   Lumbar facet syndrome (Location of Primary Source of Pain) (Bilateral) (R>L) (Chronic)    The patient had a bilateral diagnostic lumbar facet block under fluoroscopic guidance and IV sedation the provided her with more than 50% relief of the pain for the duration of localized static confirming that the lumbar facets are involved in the axial year for pain.       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 Oxycodone HCl 10 MG TABS     Sig: Take 1 tablet (10 mg total) by mouth every 6 (six) hours as needed.    Dispense:  120 tablet    Refill:  0    Do not place this medication, or any other prescription from our practice, on "Automatic Refill". Patient may have prescription filled one day early if pharmacy is closed on scheduled refill date. Do not fill until: 09/10/15 To last until: 10/10/15    [facility] & Procedure Ordered: Orders Placed This Encounter  Procedures  . LUMBAR FACET(MEDIAL BRANCH NERVE BLOCK) MBNB  . ToxASSURE Select 13 (MW), Urine  Imaging Ordered: None  Interventional Therapies: Scheduled:  Diagnostic bilateral lumbar facet block under fluoroscopic guidance and IV sedation #2.    Considering:  Possible bilateral lumbar facet radiofrequency ablation.    PRN Procedures:  None at this point.    Referral(s) or Consult(s): None at this time.  New Prescriptions   No medications on file    Medications administered during this visit: Ms. Chant had no medications administered during this visit.  Requested PM Follow-up: Return in about 4 weeks (around 09/29/2015) for Procedure (Scheduled), Medication Management, (1-Mo).  Future Appointments Date Time Provider [place]  09/16/2015 1:15 PM Milinda Pointer, MD [facility] None  10/08/2015 8:40 AM Milinda Pointer, MD [facility] None    Primary Care Physician: Molly Rogers., PA Location: [facility] Facility Note by: Molly Rogers Molly Rogers, M.D, DABA, DABAPM, DABPM, DABIPP, FIPP  Pain Score Disclaimer: We use the NRS-11 scale. This is a self-reported, subjective measurement of pain severity with only modest accuracy. It is used primarily to identify changes within a particular patient. It must be understood that outpatient pain scales are significantly less accurate that those used for research, where they can be applied under ideal controlled circumstances with minimal exposure to variables. In reality, the score is  likely to be a combination of pain intensity and pain affect, where pain affect describes the degree of emotional arousal or changes in action readiness caused by the sensory experience of pain. Factors such as social and work situation, setting, emotional state, anxiety levels, expectation, and prior pain experience may influence pain perception and show large inter-individual differences that may also be affected by time variables.  Patient instructions provided at this appointment:: Patient Instructions   GENERAL RISKS AND COMPLICATIONS  What are the risk, side effects and possible complications? Generally speaking, most procedures are safe.  However, with any procedure there are risks, side effects, and the possibility of complications.  The risks and complications are dependent upon the sites that are lesioned, or the type of nerve block to be performed.  The closer the procedure is to the spine, the more serious the risks are.  Great care is taken when placing the radio frequency needles, block needles or lesioning probes, but sometimes complications can occur. 1. Infection: Any time there is an injection through the skin, there is a risk of infection.  This is why sterile conditions are used for these blocks.  There are four possible types of infection. 1. Localized skin infection. 2. Central Nervous System Infection-This can be in the form of Meningitis, which can be deadly. 3. Epidural Infections-This can be in the form of an epidural abscess, which can cause pressure inside of the spine, causing compression of the spinal cord with subsequent paralysis. This would require an emergency surgery to decompress, and there are no guarantees that the patient would recover from the paralysis. 4. Discitis-This is an infection of the intervertebral discs.  It occurs in about 1% of discography procedures.  It is difficult to treat and it may lead to surgery.        2. Pain: the needles have to go  through skin and soft tissues, will cause soreness.       3. Damage to internal structures:  The nerves to be lesioned may be near blood vessels or    other nerves which can be potentially damaged.       4. Bleeding: Bleeding is more common if the patient is taking blood thinners such as  aspirin, Coumadin, Ticiid, Plavix, etc., or if he/she have some genetic predisposition  such as hemophilia. Bleeding into the spinal canal can cause compression of the spinal  cord with subsequent paralysis.  This would require an emergency surgery to  decompress and there are no guarantees that the patient would recover from the  paralysis.       5. Pneumothorax:  Puncturing of a lung is a possibility, every time a needle is introduced in  the area of the chest or upper back.  Pneumothorax refers to free air around the  collapsed lung(s), inside of the thoracic cavity (chest cavity).  Another two possible  complications related to a similar event would include: Hemothorax and Chylothorax.   These are variations of the Pneumothorax, where instead of air around the collapsed  lung(s), you may have blood or chyle, respectively.       6. Spinal headaches: They may occur with any procedures in the area of the spine.       7. Persistent CSF (Cerebro-Spinal Fluid) leakage: This is a rare problem, but may occur  with prolonged intrathecal or epidural catheters either due to the formation of a fistulous  track or a dural tear.       8. Nerve damage: By working so close to the spinal cord, there is always a possibility of  nerve damage, which could be as serious as a permanent spinal cord injury with  paralysis.       9. Death:  Although rare, severe deadly allergic reactions known as "Anaphylactic  reaction" can occur to any of the medications used.      10. Worsening of the symptoms:  We can always make thing worse.  What are the chances of something like this happening? Chances of any of this occuring are extremely low.  By  statistics, you have more of a chance of getting killed in a motor vehicle accident: while driving to the hospital than any of the above occurring .  Nevertheless, you should be aware that they are possibilities.  In general, it is similar to taking a shower.  Everybody knows that you can slip, hit your head and get killed.  Does that mean that you should not shower again?  Nevertheless always keep in mind that statistics do not mean anything if you happen to be on the wrong side of them.  Even if a procedure has a 1 (one) in a 1,000,000 (million) chance of going wrong, it you happen to be that one..Also, keep in mind that by statistics, you have more of a chance of having something go wrong when taking medications.  Who should not have this procedure? If you are on a blood thinning medication (e.g. Coumadin, Plavix, see list of "Blood Thinners"), or if you have an active infection going on, you should not have the procedure.  If you are taking any blood thinners, please inform your physician.  How should I prepare for this procedure?  Do not eat or drink anything at least six hours prior to the procedure.  Bring a driver with you .  It cannot be a taxi.  Come accompanied by an adult that can drive you back, and that is strong enough to help you if your legs get weak or numb from the local anesthetic.  Take all of your medicines the morning of the procedure with just enough water to swallow them.  If you have diabetes, make sure that you are scheduled to have your  procedure done first thing in the morning, whenever possible.  If you have diabetes, take only half of your insulin dose and notify our nurse that you have done so as soon as you arrive at the clinic.  If you are diabetic, but only take blood sugar pills (oral hypoglycemic), then do not take them on the morning of your procedure.  You may take them after you have had the procedure.  Do not take aspirin or any aspirin-containing  medications, at least eleven (11) days prior to the procedure.  They may prolong bleeding.  Wear loose fitting clothing that may be easy to take off and that you would not mind if it got stained with Betadine or blood.  Do not wear any jewelry or perfume  Remove any nail coloring.  It will interfere with some of our monitoring equipment.  NOTE: Remember that this is not meant to be interpreted as a complete list of all possible complications.  Unforeseen problems may occur.  BLOOD THINNERS The following drugs contain aspirin or other products, which can cause increased bleeding during surgery and should not be taken for 2 weeks prior to and 1 week after surgery.  If you should need take something for relief of minor pain, you may take acetaminophen which is found in Tylenol,m Datril, Anacin-3 and Panadol. It is not blood thinner. The products listed below are.  Do not take any of the products listed below in addition to any listed on your instruction sheet.  A.P.C or A.P.C with Codeine Codeine Phosphate Capsules #3 Ibuprofen Ridaura  ABC compound Congesprin Imuran rimadil  Advil Cope Indocin Robaxisal  Alka-Seltzer Effervescent Pain Reliever and Antacid Coricidin or Coricidin-D  Indomethacin Rufen  Alka-Seltzer plus Cold Medicine Cosprin Ketoprofen S-A-C Tablets  Anacin Analgesic Tablets or Capsules Coumadin Korlgesic Salflex  Anacin Extra Strength Analgesic tablets or capsules CP-2 Tablets Lanoril Salicylate  Anaprox Cuprimine Capsules Levenox Salocol  Anexsia-D Dalteparin Magan Salsalate  Anodynos Darvon compound Magnesium Salicylate Sine-off  Ansaid Dasin Capsules Magsal Sodium Salicylate  Anturane Depen Capsules Marnal Soma  APF Arthritis pain formula Dewitt's Pills Measurin Stanback  Argesic Dia-Gesic Meclofenamic Sulfinpyrazone  Arthritis Bayer Timed Release Aspirin Diclofenac Meclomen Sulindac  Arthritis pain formula Anacin Dicumarol Medipren Supac  Analgesic (Safety coated)  Arthralgen Diffunasal Mefanamic Suprofen  Arthritis Strength Bufferin Dihydrocodeine Mepro Compound Suprol  Arthropan liquid Dopirydamole Methcarbomol with Aspirin Synalgos  ASA tablets/Enseals Disalcid Micrainin Tagament  Ascriptin Doan's Midol Talwin  Ascriptin A/D Dolene Mobidin Tanderil  Ascriptin Extra Strength Dolobid Moblgesic Ticlid  Ascriptin with Codeine Doloprin or Doloprin with Codeine Momentum Tolectin  Asperbuf Duoprin Mono-gesic Trendar  Aspergum Duradyne Motrin or Motrin IB Triminicin  Aspirin plain, buffered or enteric coated Durasal Myochrisine Trigesic  Aspirin Suppositories Easprin Nalfon Trillsate  Aspirin with Codeine Ecotrin Regular or Extra Strength Naprosyn Uracel  Atromid-S Efficin Naproxen Ursinus  Auranofin Capsules Elmiron Neocylate Vanquish  Axotal Emagrin Norgesic Verin  Azathioprine Empirin or Empirin with Codeine Normiflo Vitamin E  Azolid Emprazil Nuprin Voltaren  Bayer Aspirin plain, buffered or children's or timed BC Tablets or powders Encaprin Orgaran Warfarin Sodium  Buff-a-Comp Enoxaparin Orudis Zorpin  Buff-a-Comp with Codeine Equegesic Os-Cal-Gesic   Buffaprin Excedrin plain, buffered or Extra Strength Oxalid   Bufferin Arthritis Strength Feldene Oxphenbutazone   Bufferin plain or Extra Strength Feldene Capsules Oxycodone with Aspirin   Bufferin with Codeine Fenoprofen Fenoprofen Pabalate or Pabalate-SF   Buffets II Flogesic Panagesic   Buffinol plain or Extra Strength Florinal or Florinal with  Codeine Panwarfarin   Buf-Tabs Flurbiprofen Penicillamine   Butalbital Compound Four-way cold tablets Penicillin   Butazolidin Fragmin Pepto-Bismol   Carbenicillin Geminisyn Percodan   Carna Arthritis Reliever Geopen Persantine   Carprofen Gold's salt Persistin   Chloramphenicol Goody's Phenylbutazone   Chloromycetin Haltrain Piroxlcam   Clmetidine heparin Plaquenil   Cllnoril Hyco-pap Ponstel   Clofibrate Hydroxy chloroquine Propoxyphen          Before stopping any of these medications, be sure to consult the physician who ordered them.  Some, such as Coumadin (Warfarin) are ordered to prevent or treat serious conditions such as "deep thrombosis", "pumonary embolisms", and other heart problems.  The amount of time that you may need off of the medication may also vary with the medication and the reason for which you were taking it.  If you are taking any of these medications, please make sure you notify your pain physician before you undergo any procedures.         Facet Blocks Patient Information  Description: The facets are joints in the spine between the vertebrae.  Like any joints in the body, facets can become irritated and painful.  Arthritis can also effect the facets.  By injecting steroids and local anesthetic in and around these joints, we can temporarily block the nerve supply to them.  Steroids act directly on irritated nerves and tissues to reduce selling and inflammation which often leads to decreased pain.  Facet blocks may be done anywhere along the spine from the neck to the low back depending upon the location of your pain.   After numbing the skin with local anesthetic (like Novocaine), a small needle is passed onto the facet joints under x-ray guidance.  You may experience a sensation of pressure while this is being done.  The entire block usually lasts about 15-25 minutes.   Conditions which may be treated by facet blocks:   Low back/buttock pain  Neck/shoulder pain  Certain types of headaches  Preparation for the injection:  1. Do not eat any solid food or dairy products within 8 hours of your appointment. 2. You may drink clear liquid up to 3 hours before appointment.  Clear liquids include water, black coffee, juice or soda.  No milk or cream please. 3. You may take your regular medication, including pain medications, with a sip of water before your appointment.  Diabetics should hold regular insulin  (if taken separately) and take 1/2 normal NPH dose the morning of the procedure.  Carry some sugar containing items with you to your appointment. 4. A driver must accompany you and be prepared to drive you home after your procedure. 5. Bring all your current medications with you. 6. An IV may be inserted and sedation may be given at the discretion of the physician. 7. A blood pressure cuff, EKG and other monitors will often be applied during the procedure.  Some patients may need to have extra oxygen administered for a short period. 8. You will be asked to provide medical information, including your allergies and medications, prior to the procedure.  We must know immediately if you are taking blood thinners (like Coumadin/Warfarin) or if you are allergic to IV iodine contrast (dye).  We must know if you could possible be pregnant.  Possible side-effects:   Bleeding from needle site  Infection (rare, may require surgery)  Nerve injury (rare)  Numbness & tingling (temporary)  Difficulty urinating (rare, temporary)  Spinal headache (a headache worse with upright posture)  Light-headedness (temporary)  Pain at injection site (serveral days)  Decreased blood pressure (rare, temporary)  Weakness in arm/leg (temporary)  Pressure sensation in back/neck (temporary)   Call if you experience:   Fever/chills associated with headache or increased back/neck pain  Headache worsened by an upright position  New onset, weakness or numbness of an extremity below the injection site  Hives or difficulty breathing (go to the emergency room)  Inflammation or drainage at the injection site(s)  Severe back/neck pain greater than usual  New symptoms which are concerning to you  Please note:  Although the local anesthetic injected can often make your back or neck feel good for several hours after the injection, the pain will likely return. It takes 3-7 days for steroids to work.  You may not  notice any pain relief for at least one week.  If effective, we will often do a series of 2-3 injections spaced 3-6 weeks apart to maximally decrease your pain.  After the initial series, you may be a candidate for a more permanent nerve block of the facets.  If you have any questions, please call #[phone]) [facility]

## 2015-09-02 NOTE — Patient Instructions (Signed)
GENERAL RISKS AND COMPLICATIONS  What are the risk, side effects and possible complications? Generally speaking, most procedures are safe.  However, with any procedure there are risks, side effects, and the possibility of complications.  The risks and complications are dependent upon the sites that are lesioned, or the type of nerve block to be performed.  The closer the procedure is to the spine, the more serious the risks are.  Great care is taken when placing the radio frequency needles, block needles or lesioning probes, but sometimes complications can occur. 1. Infection: Any time there is an injection through the skin, there is a risk of infection.  This is why sterile conditions are used for these blocks.  There are four possible types of infection. 1. Localized skin infection. 2. Central Nervous System Infection-This can be in the form of Meningitis, which can be deadly. 3. Epidural Infections-This can be in the form of an epidural abscess, which can cause pressure inside of the spine, causing compression of the spinal cord with subsequent paralysis. This would require an emergency surgery to decompress, and there are no guarantees that the patient would recover from the paralysis. 4. Discitis-This is an infection of the intervertebral discs.  It occurs in about 1% of discography procedures.  It is difficult to treat and it may lead to surgery.        2. Pain: the needles have to go through skin and soft tissues, will cause soreness.       3. Damage to internal structures:  The nerves to be lesioned may be near blood vessels or    other nerves which can be potentially damaged.       4. Bleeding: Bleeding is more common if the patient is taking blood thinners such as  aspirin, Coumadin, Ticiid, Plavix, etc., or if he/she have some genetic predisposition  such as hemophilia. Bleeding into the spinal canal can cause compression of the spinal  cord with subsequent paralysis.  This would require an  emergency surgery to  decompress and there are no guarantees that the patient would recover from the  paralysis.       5. Pneumothorax:  Puncturing of a lung is a possibility, every time a needle is introduced in  the area of the chest or upper back.  Pneumothorax refers to free air around the  collapsed lung(s), inside of the thoracic cavity (chest cavity).  Another two possible  complications related to a similar event would include: Hemothorax and Chylothorax.   These are variations of the Pneumothorax, where instead of air around the collapsed  lung(s), you may have blood or chyle, respectively.       6. Spinal headaches: They may occur with any procedures in the area of the spine.       7. Persistent CSF (Cerebro-Spinal Fluid) leakage: This is a rare problem, but may occur  with prolonged intrathecal or epidural catheters either due to the formation of a fistulous  track or a dural tear.       8. Nerve damage: By working so close to the spinal cord, there is always a possibility of  nerve damage, which could be as serious as a permanent spinal cord injury with  paralysis.       9. Death:  Although rare, severe deadly allergic reactions known as "Anaphylactic  reaction" can occur to any of the medications used.      10. Worsening of the symptoms:  We can always make thing worse.    What are the chances of something like this happening? Chances of any of this occuring are extremely low.  By statistics, you have more of a chance of getting killed in a motor vehicle accident: while driving to the hospital than any of the above occurring .  Nevertheless, you should be aware that they are possibilities.  In general, it is similar to taking a shower.  Everybody knows that you can slip, hit your head and get killed.  Does that mean that you should not shower again?  Nevertheless always keep in mind that statistics do not mean anything if you happen to be on the wrong side of them.  Even if a procedure has a 1  (one) in a 1,000,000 (million) chance of going wrong, it you happen to be that one..Also, keep in mind that by statistics, you have more of a chance of having something go wrong when taking medications.  Who should not have this procedure? If you are on a blood thinning medication (e.g. Coumadin, Plavix, see list of "Blood Thinners"), or if you have an active infection going on, you should not have the procedure.  If you are taking any blood thinners, please inform your physician.  How should I prepare for this procedure?  Do not eat or drink anything at least six hours prior to the procedure.  Bring a driver with you .  It cannot be a taxi.  Come accompanied by an adult that can drive you back, and that is strong enough to help you if your legs get weak or numb from the local anesthetic.  Take all of your medicines the morning of the procedure with just enough water to swallow them.  If you have diabetes, make sure that you are scheduled to have your procedure done first thing in the morning, whenever possible.  If you have diabetes, take only half of your insulin dose and notify our nurse that you have done so as soon as you arrive at the clinic.  If you are diabetic, but only take blood sugar pills (oral hypoglycemic), then do not take them on the morning of your procedure.  You may take them after you have had the procedure.  Do not take aspirin or any aspirin-containing medications, at least eleven (11) days prior to the procedure.  They may prolong bleeding.  Wear loose fitting clothing that may be easy to take off and that you would not mind if it got stained with Betadine or blood.  Do not wear any jewelry or perfume  Remove any nail coloring.  It will interfere with some of our monitoring equipment.  NOTE: Remember that this is not meant to be interpreted as a complete list of all possible complications.  Unforeseen problems may occur.  BLOOD THINNERS The following drugs  contain aspirin or other products, which can cause increased bleeding during surgery and should not be taken for 2 weeks prior to and 1 week after surgery.  If you should need take something for relief of minor pain, you may take acetaminophen which is found in Tylenol,m Datril, Anacin-3 and Panadol. It is not blood thinner. The products listed below are.  Do not take any of the products listed below in addition to any listed on your instruction sheet.  A.P.C or A.P.C with Codeine Codeine Phosphate Capsules #3 Ibuprofen Ridaura  ABC compound Congesprin Imuran rimadil  Advil Cope Indocin Robaxisal  Alka-Seltzer Effervescent Pain Reliever and Antacid Coricidin or Coricidin-D  Indomethacin Rufen    Alka-Seltzer plus Cold Medicine Cosprin Ketoprofen S-A-C Tablets  Anacin Analgesic Tablets or Capsules Coumadin Korlgesic Salflex  Anacin Extra Strength Analgesic tablets or capsules CP-2 Tablets Lanoril Salicylate  Anaprox Cuprimine Capsules Levenox Salocol  Anexsia-D Dalteparin Magan Salsalate  Anodynos Darvon compound Magnesium Salicylate Sine-off  Ansaid Dasin Capsules Magsal Sodium Salicylate  Anturane Depen Capsules Marnal Soma  APF Arthritis pain formula Dewitt's Pills Measurin Stanback  Argesic Dia-Gesic Meclofenamic Sulfinpyrazone  Arthritis Bayer Timed Release Aspirin Diclofenac Meclomen Sulindac  Arthritis pain formula Anacin Dicumarol Medipren Supac  Analgesic (Safety coated) Arthralgen Diffunasal Mefanamic Suprofen  Arthritis Strength Bufferin Dihydrocodeine Mepro Compound Suprol  Arthropan liquid Dopirydamole Methcarbomol with Aspirin Synalgos  ASA tablets/Enseals Disalcid Micrainin Tagament  Ascriptin Doan's Midol Talwin  Ascriptin A/D Dolene Mobidin Tanderil  Ascriptin Extra Strength Dolobid Moblgesic Ticlid  Ascriptin with Codeine Doloprin or Doloprin with Codeine Momentum Tolectin  Asperbuf Duoprin Mono-gesic Trendar  Aspergum Duradyne Motrin or Motrin IB Triminicin  Aspirin  plain, buffered or enteric coated Durasal Myochrisine Trigesic  Aspirin Suppositories Easprin Nalfon Trillsate  Aspirin with Codeine Ecotrin Regular or Extra Strength Naprosyn Uracel  Atromid-S Efficin Naproxen Ursinus  Auranofin Capsules Elmiron Neocylate Vanquish  Axotal Emagrin Norgesic Verin  Azathioprine Empirin or Empirin with Codeine Normiflo Vitamin E  Azolid Emprazil Nuprin Voltaren  Bayer Aspirin plain, buffered or children's or timed BC Tablets or powders Encaprin Orgaran Warfarin Sodium  Buff-a-Comp Enoxaparin Orudis Zorpin  Buff-a-Comp with Codeine Equegesic Os-Cal-Gesic   Buffaprin Excedrin plain, buffered or Extra Strength Oxalid   Bufferin Arthritis Strength Feldene Oxphenbutazone   Bufferin plain or Extra Strength Feldene Capsules Oxycodone with Aspirin   Bufferin with Codeine Fenoprofen Fenoprofen Pabalate or Pabalate-SF   Buffets II Flogesic Panagesic   Buffinol plain or Extra Strength Florinal or Florinal with Codeine Panwarfarin   Buf-Tabs Flurbiprofen Penicillamine   Butalbital Compound Four-way cold tablets Penicillin   Butazolidin Fragmin Pepto-Bismol   Carbenicillin Geminisyn Percodan   Carna Arthritis Reliever Geopen Persantine   Carprofen Gold's salt Persistin   Chloramphenicol Goody's Phenylbutazone   Chloromycetin Haltrain Piroxlcam   Clmetidine heparin Plaquenil   Cllnoril Hyco-pap Ponstel   Clofibrate Hydroxy chloroquine Propoxyphen         Before stopping any of these medications, be sure to consult the physician who ordered them.  Some, such as Coumadin (Warfarin) are ordered to prevent or treat serious conditions such as "deep thrombosis", "pumonary embolisms", and other heart problems.  The amount of time that you may need off of the medication may also vary with the medication and the reason for which you were taking it.  If you are taking any of these medications, please make sure you notify your pain physician before you undergo any  procedures.         Facet Blocks Patient Information  Description: The facets are joints in the spine between the vertebrae.  Like any joints in the body, facets can become irritated and painful.  Arthritis can also effect the facets.  By injecting steroids and local anesthetic in and around these joints, we can temporarily block the nerve supply to them.  Steroids act directly on irritated nerves and tissues to reduce selling and inflammation which often leads to decreased pain.  Facet blocks may be done anywhere along the spine from the neck to the low back depending upon the location of your pain.   After numbing the skin with local anesthetic (like Novocaine), a small needle is passed onto the facet joints under x-ray guidance.    You may experience a sensation of pressure while this is being done.  The entire block usually lasts about 15-25 minutes.   Conditions which may be treated by facet blocks:   Low back/buttock pain  Neck/shoulder pain  Certain types of headaches  Preparation for the injection:  1. Do not eat any solid food or dairy products within 8 hours of your appointment. 2. You may drink clear liquid up to 3 hours before appointment.  Clear liquids include water, black coffee, juice or soda.  No milk or cream please. 3. You may take your regular medication, including pain medications, with a sip of water before your appointment.  Diabetics should hold regular insulin (if taken separately) and take 1/2 normal NPH dose the morning of the procedure.  Carry some sugar containing items with you to your appointment. 4. A driver must accompany you and be prepared to drive you home after your procedure. 5. Bring all your current medications with you. 6. An IV may be inserted and sedation may be given at the discretion of the physician. 7. A blood pressure cuff, EKG and other monitors will often be applied during the procedure.  Some patients may need to have extra oxygen  administered for a short period. 8. You will be asked to provide medical information, including your allergies and medications, prior to the procedure.  We must know immediately if you are taking blood thinners (like Coumadin/Warfarin) or if you are allergic to IV iodine contrast (dye).  We must know if you could possible be pregnant.  Possible side-effects:   Bleeding from needle site  Infection (rare, may require surgery)  Nerve injury (rare)  Numbness & tingling (temporary)  Difficulty urinating (rare, temporary)  Spinal headache (a headache worse with upright posture)  Light-headedness (temporary)  Pain at injection site (serveral days)  Decreased blood pressure (rare, temporary)  Weakness in arm/leg (temporary)  Pressure sensation in back/neck (temporary)   Call if you experience:   Fever/chills associated with headache or increased back/neck pain  Headache worsened by an upright position  New onset, weakness or numbness of an extremity below the injection site  Hives or difficulty breathing (go to the emergency room)  Inflammation or drainage at the injection site(s)  Severe back/neck pain greater than usual  New symptoms which are concerning to you  Please note:  Although the local anesthetic injected can often make your back or neck feel good for several hours after the injection, the pain will likely return. It takes 3-7 days for steroids to work.  You may not notice any pain relief for at least one week.  If effective, we will often do a series of 2-3 injections spaced 3-6 weeks apart to maximally decrease your pain.  After the initial series, you may be a candidate for a more permanent nerve block of the facets.  If you have any questions, please call [phone] [facility] 

## 2015-09-02 NOTE — Assessment & Plan Note (Signed)
The patient had a bilateral diagnostic lumbar facet block under fluoroscopic guidance and IV sedation the provided her with more than 50% relief of the pain for the duration of localized static confirming that the lumbar facets are involved in the axial year for pain.

## 2015-09-04 ENCOUNTER — Other Ambulatory Visit: Payer: Self-pay | Admitting: Physician Assistant

## 2015-09-04 DIAGNOSIS — Z1231 Encounter for screening mammogram for malignant neoplasm of breast: Secondary | ICD-10-CM

## 2015-09-11 ENCOUNTER — Telehealth: Payer: Self-pay | Admitting: *Deleted

## 2015-09-11 LAB — TOXASSURE SELECT 13 (MW), URINE: PDF: 0

## 2015-09-11 NOTE — Telephone Encounter (Signed)
pt called to cancel and r/s due to no transportation...td

## 2015-09-16 ENCOUNTER — Ambulatory Visit: Payer: Medicaid Other | Admitting: Pain Medicine

## 2015-09-29 DIAGNOSIS — Z87898 Personal history of other specified conditions: Secondary | ICD-10-CM | POA: Insufficient documentation

## 2015-10-03 ENCOUNTER — Ambulatory Visit: Payer: Medicaid Other

## 2015-10-08 ENCOUNTER — Encounter: Payer: Self-pay | Admitting: Pain Medicine

## 2015-10-08 ENCOUNTER — Ambulatory Visit: Payer: Medicaid Other | Attending: Pain Medicine | Admitting: Pain Medicine

## 2015-10-08 VITALS — BP 115/71 | HR 85 | Temp 98.2°F | Resp 16 | Ht 64.0 in | Wt 220.0 lb

## 2015-10-08 DIAGNOSIS — M160 Bilateral primary osteoarthritis of hip: Secondary | ICD-10-CM

## 2015-10-08 DIAGNOSIS — M7732 Calcaneal spur, left foot: Secondary | ICD-10-CM | POA: Insufficient documentation

## 2015-10-08 DIAGNOSIS — G8929 Other chronic pain: Secondary | ICD-10-CM | POA: Diagnosis not present

## 2015-10-08 DIAGNOSIS — R0602 Shortness of breath: Secondary | ICD-10-CM | POA: Insufficient documentation

## 2015-10-08 DIAGNOSIS — M7060 Trochanteric bursitis, unspecified hip: Secondary | ICD-10-CM | POA: Insufficient documentation

## 2015-10-08 DIAGNOSIS — M4802 Spinal stenosis, cervical region: Secondary | ICD-10-CM | POA: Insufficient documentation

## 2015-10-08 DIAGNOSIS — I480 Paroxysmal atrial fibrillation: Secondary | ICD-10-CM | POA: Insufficient documentation

## 2015-10-08 DIAGNOSIS — Z5181 Encounter for therapeutic drug level monitoring: Secondary | ICD-10-CM | POA: Diagnosis not present

## 2015-10-08 DIAGNOSIS — M4316 Spondylolisthesis, lumbar region: Secondary | ICD-10-CM | POA: Diagnosis not present

## 2015-10-08 DIAGNOSIS — M25559 Pain in unspecified hip: Secondary | ICD-10-CM | POA: Insufficient documentation

## 2015-10-08 DIAGNOSIS — M7731 Calcaneal spur, right foot: Secondary | ICD-10-CM | POA: Insufficient documentation

## 2015-10-08 DIAGNOSIS — M7918 Myalgia, other site: Secondary | ICD-10-CM | POA: Insufficient documentation

## 2015-10-08 DIAGNOSIS — D329 Benign neoplasm of meninges, unspecified: Secondary | ICD-10-CM | POA: Diagnosis not present

## 2015-10-08 DIAGNOSIS — F1190 Opioid use, unspecified, uncomplicated: Secondary | ICD-10-CM | POA: Diagnosis not present

## 2015-10-08 DIAGNOSIS — G4730 Sleep apnea, unspecified: Secondary | ICD-10-CM | POA: Diagnosis not present

## 2015-10-08 DIAGNOSIS — E782 Mixed hyperlipidemia: Secondary | ICD-10-CM | POA: Insufficient documentation

## 2015-10-08 DIAGNOSIS — M1712 Unilateral primary osteoarthritis, left knee: Secondary | ICD-10-CM | POA: Insufficient documentation

## 2015-10-08 DIAGNOSIS — R51 Headache: Secondary | ICD-10-CM | POA: Diagnosis not present

## 2015-10-08 DIAGNOSIS — L309 Dermatitis, unspecified: Secondary | ICD-10-CM | POA: Diagnosis not present

## 2015-10-08 DIAGNOSIS — I509 Heart failure, unspecified: Secondary | ICD-10-CM | POA: Diagnosis not present

## 2015-10-08 DIAGNOSIS — J849 Interstitial pulmonary disease, unspecified: Secondary | ICD-10-CM | POA: Insufficient documentation

## 2015-10-08 DIAGNOSIS — I10 Essential (primary) hypertension: Secondary | ICD-10-CM | POA: Insufficient documentation

## 2015-10-08 DIAGNOSIS — M48061 Spinal stenosis, lumbar region without neurogenic claudication: Secondary | ICD-10-CM

## 2015-10-08 DIAGNOSIS — M5126 Other intervertebral disc displacement, lumbar region: Secondary | ICD-10-CM | POA: Insufficient documentation

## 2015-10-08 DIAGNOSIS — Z8673 Personal history of transient ischemic attack (TIA), and cerebral infarction without residual deficits: Secondary | ICD-10-CM | POA: Diagnosis not present

## 2015-10-08 DIAGNOSIS — M79606 Pain in leg, unspecified: Secondary | ICD-10-CM

## 2015-10-08 DIAGNOSIS — R4701 Aphasia: Secondary | ICD-10-CM | POA: Insufficient documentation

## 2015-10-08 DIAGNOSIS — M2242 Chondromalacia patellae, left knee: Secondary | ICD-10-CM | POA: Insufficient documentation

## 2015-10-08 DIAGNOSIS — M545 Low back pain, unspecified: Secondary | ICD-10-CM

## 2015-10-08 DIAGNOSIS — M549 Dorsalgia, unspecified: Secondary | ICD-10-CM | POA: Diagnosis present

## 2015-10-08 DIAGNOSIS — F17210 Nicotine dependence, cigarettes, uncomplicated: Secondary | ICD-10-CM | POA: Insufficient documentation

## 2015-10-08 DIAGNOSIS — Q762 Congenital spondylolisthesis: Secondary | ICD-10-CM

## 2015-10-08 DIAGNOSIS — M791 Myalgia: Secondary | ICD-10-CM | POA: Insufficient documentation

## 2015-10-08 DIAGNOSIS — I4891 Unspecified atrial fibrillation: Secondary | ICD-10-CM | POA: Insufficient documentation

## 2015-10-08 DIAGNOSIS — F419 Anxiety disorder, unspecified: Secondary | ICD-10-CM | POA: Diagnosis not present

## 2015-10-08 DIAGNOSIS — M4310 Spondylolisthesis, site unspecified: Secondary | ICD-10-CM

## 2015-10-08 DIAGNOSIS — M4806 Spinal stenosis, lumbar region: Secondary | ICD-10-CM

## 2015-10-08 DIAGNOSIS — M533 Sacrococcygeal disorders, not elsewhere classified: Secondary | ICD-10-CM | POA: Insufficient documentation

## 2015-10-08 DIAGNOSIS — I340 Nonrheumatic mitral (valve) insufficiency: Secondary | ICD-10-CM | POA: Insufficient documentation

## 2015-10-08 DIAGNOSIS — R109 Unspecified abdominal pain: Secondary | ICD-10-CM | POA: Insufficient documentation

## 2015-10-08 DIAGNOSIS — M47816 Spondylosis without myelopathy or radiculopathy, lumbar region: Secondary | ICD-10-CM

## 2015-10-08 DIAGNOSIS — I4892 Unspecified atrial flutter: Secondary | ICD-10-CM | POA: Insufficient documentation

## 2015-10-08 DIAGNOSIS — M7061 Trochanteric bursitis, right hip: Secondary | ICD-10-CM | POA: Insufficient documentation

## 2015-10-08 DIAGNOSIS — M25551 Pain in right hip: Secondary | ICD-10-CM | POA: Insufficient documentation

## 2015-10-08 DIAGNOSIS — M25552 Pain in left hip: Secondary | ICD-10-CM | POA: Insufficient documentation

## 2015-10-08 DIAGNOSIS — Z79891 Long term (current) use of opiate analgesic: Secondary | ICD-10-CM | POA: Diagnosis not present

## 2015-10-08 DIAGNOSIS — M25569 Pain in unspecified knee: Secondary | ICD-10-CM | POA: Diagnosis present

## 2015-10-08 DIAGNOSIS — M792 Neuralgia and neuritis, unspecified: Secondary | ICD-10-CM

## 2015-10-08 DIAGNOSIS — J449 Chronic obstructive pulmonary disease, unspecified: Secondary | ICD-10-CM | POA: Insufficient documentation

## 2015-10-08 DIAGNOSIS — M797 Fibromyalgia: Secondary | ICD-10-CM | POA: Insufficient documentation

## 2015-10-08 DIAGNOSIS — Z6841 Body Mass Index (BMI) 40.0 and over, adult: Secondary | ICD-10-CM | POA: Insufficient documentation

## 2015-10-08 MED ORDER — OXYCODONE HCL 10 MG PO TABS: 10.0000 mg | ORAL_TABLET | Freq: Four times a day (QID) | ORAL | Status: AC | PRN

## 2015-10-08 MED ORDER — GABAPENTIN 300 MG PO CAPS: 300.0000 mg | ORAL_CAPSULE | Freq: Three times a day (TID) | ORAL | Status: AC

## 2015-10-08 MED ORDER — CYCLOBENZAPRINE HCL 10 MG PO TABS: 10.0000 mg | ORAL_TABLET | Freq: Two times a day (BID) | ORAL | Status: AC

## 2015-10-08 NOTE — Progress Notes (Signed)
Oxycodone pill count #9/120  Filled 09-10-15

## 2015-10-08 NOTE — Patient Instructions (Signed)
GENERAL RISKS AND COMPLICATIONS  What are the risk, side effects and possible complications? Generally speaking, most procedures are safe.  However, with any procedure there are risks, side effects, and the possibility of complications.  The risks and complications are dependent upon the sites that are lesioned, or the type of nerve block to be performed.  The closer the procedure is to the spine, the more serious the risks are.  Great care is taken when placing the radio frequency needles, block needles or lesioning probes, but sometimes complications can occur. 1. Infection: Any time there is an injection through the skin, there is a risk of infection.  This is why sterile conditions are used for these blocks.  There are four possible types of infection. 1. Localized skin infection. 2. Central Nervous System Infection-This can be in the form of Meningitis, which can be deadly. 3. Epidural Infections-This can be in the form of an epidural abscess, which can cause pressure inside of the spine, causing compression of the spinal cord with subsequent paralysis. This would require an emergency surgery to decompress, and there are no guarantees that the patient would recover from the paralysis. 4. Discitis-This is an infection of the intervertebral discs.  It occurs in about 1% of discography procedures.  It is difficult to treat and it may lead to surgery.        2. Pain: the needles have to go through skin and soft tissues, will cause soreness.       3. Damage to internal structures:  The nerves to be lesioned may be near blood vessels or    other nerves which can be potentially damaged.       4. Bleeding: Bleeding is more common if the patient is taking blood thinners such as  aspirin, Coumadin, Ticiid, Plavix, etc., or if he/she have some genetic predisposition  such as hemophilia. Bleeding into the spinal canal can cause compression of the spinal  cord with subsequent paralysis.  This would require an  emergency surgery to  decompress and there are no guarantees that the patient would recover from the  paralysis.       5. Pneumothorax:  Puncturing of a lung is a possibility, every time a needle is introduced in  the area of the chest or upper back.  Pneumothorax refers to free air around the  collapsed lung(s), inside of the thoracic cavity (chest cavity).  Another two possible  complications related to a similar event would include: Hemothorax and Chylothorax.   These are variations of the Pneumothorax, where instead of air around the collapsed  lung(s), you may have blood or chyle, respectively.       6. Spinal headaches: They may occur with any procedures in the area of the spine.       7. Persistent CSF (Cerebro-Spinal Fluid) leakage: This is a rare problem, but may occur  with prolonged intrathecal or epidural catheters either due to the formation of a fistulous  track or a dural tear.       8. Nerve damage: By working so close to the spinal cord, there is always a possibility of  nerve damage, which could be as serious as a permanent spinal cord injury with  paralysis.       9. Death:  Although rare, severe deadly allergic reactions known as "Anaphylactic  reaction" can occur to any of the medications used.      10. Worsening of the symptoms:  We can always make thing worse.    What are the chances of something like this happening? Chances of any of this occuring are extremely low.  By statistics, you have more of a chance of getting killed in a motor vehicle accident: while driving to the hospital than any of the above occurring .  Nevertheless, you should be aware that they are possibilities.  In general, it is similar to taking a shower.  Everybody knows that you can slip, hit your head and get killed.  Does that mean that you should not shower again?  Nevertheless always keep in mind that statistics do not mean anything if you happen to be on the wrong side of them.  Even if a procedure has a 1  (one) in a 1,000,000 (million) chance of going wrong, it you happen to be that one..Also, keep in mind that by statistics, you have more of a chance of having something go wrong when taking medications.  Who should not have this procedure? If you are on a blood thinning medication (e.g. Coumadin, Plavix, see list of "Blood Thinners"), or if you have an active infection going on, you should not have the procedure.  If you are taking any blood thinners, please inform your physician.  How should I prepare for this procedure?  Do not eat or drink anything at least six hours prior to the procedure.  Bring a driver with you .  It cannot be a taxi.  Come accompanied by an adult that can drive you back, and that is strong enough to help you if your legs get weak or numb from the local anesthetic.  Take all of your medicines the morning of the procedure with just enough water to swallow them.  If you have diabetes, make sure that you are scheduled to have your procedure done first thing in the morning, whenever possible.  If you have diabetes, take only half of your insulin dose and notify our nurse that you have done so as soon as you arrive at the clinic.  If you are diabetic, but only take blood sugar pills (oral hypoglycemic), then do not take them on the morning of your procedure.  You may take them after you have had the procedure.  Do not take aspirin or any aspirin-containing medications, at least eleven (11) days prior to the procedure.  They may prolong bleeding.  Wear loose fitting clothing that may be easy to take off and that you would not mind if it got stained with Betadine or blood.  Do not wear any jewelry or perfume  Remove any nail coloring.  It will interfere with some of our monitoring equipment.  NOTE: Remember that this is not meant to be interpreted as a complete list of all possible complications.  Unforeseen problems may occur.  BLOOD THINNERS The following drugs  contain aspirin or other products, which can cause increased bleeding during surgery and should not be taken for 2 weeks prior to and 1 week after surgery.  If you should need take something for relief of minor pain, you may take acetaminophen which is found in Tylenol,m Datril, Anacin-3 and Panadol. It is not blood thinner. The products listed below are.  Do not take any of the products listed below in addition to any listed on your instruction sheet.  A.P.C or A.P.C with Codeine Codeine Phosphate Capsules #3 Ibuprofen Ridaura  ABC compound Congesprin Imuran rimadil  Advil Cope Indocin Robaxisal  Alka-Seltzer Effervescent Pain Reliever and Antacid Coricidin or Coricidin-D  Indomethacin Rufen    Alka-Seltzer plus Cold Medicine Cosprin Ketoprofen S-A-C Tablets  Anacin Analgesic Tablets or Capsules Coumadin Korlgesic Salflex  Anacin Extra Strength Analgesic tablets or capsules CP-2 Tablets Lanoril Salicylate  Anaprox Cuprimine Capsules Levenox Salocol  Anexsia-D Dalteparin Magan Salsalate  Anodynos Darvon compound Magnesium Salicylate Sine-off  Ansaid Dasin Capsules Magsal Sodium Salicylate  Anturane Depen Capsules Marnal Soma  APF Arthritis pain formula Dewitt's Pills Measurin Stanback  Argesic Dia-Gesic Meclofenamic Sulfinpyrazone  Arthritis Bayer Timed Release Aspirin Diclofenac Meclomen Sulindac  Arthritis pain formula Anacin Dicumarol Medipren Supac  Analgesic (Safety coated) Arthralgen Diffunasal Mefanamic Suprofen  Arthritis Strength Bufferin Dihydrocodeine Mepro Compound Suprol  Arthropan liquid Dopirydamole Methcarbomol with Aspirin Synalgos  ASA tablets/Enseals Disalcid Micrainin Tagament  Ascriptin Doan's Midol Talwin  Ascriptin A/D Dolene Mobidin Tanderil  Ascriptin Extra Strength Dolobid Moblgesic Ticlid  Ascriptin with Codeine Doloprin or Doloprin with Codeine Momentum Tolectin  Asperbuf Duoprin Mono-gesic Trendar  Aspergum Duradyne Motrin or Motrin IB Triminicin  Aspirin  plain, buffered or enteric coated Durasal Myochrisine Trigesic  Aspirin Suppositories Easprin Nalfon Trillsate  Aspirin with Codeine Ecotrin Regular or Extra Strength Naprosyn Uracel  Atromid-S Efficin Naproxen Ursinus  Auranofin Capsules Elmiron Neocylate Vanquish  Axotal Emagrin Norgesic Verin  Azathioprine Empirin or Empirin with Codeine Normiflo Vitamin E  Azolid Emprazil Nuprin Voltaren  Bayer Aspirin plain, buffered or children's or timed BC Tablets or powders Encaprin Orgaran Warfarin Sodium  Buff-a-Comp Enoxaparin Orudis Zorpin  Buff-a-Comp with Codeine Equegesic Os-Cal-Gesic   Buffaprin Excedrin plain, buffered or Extra Strength Oxalid   Bufferin Arthritis Strength Feldene Oxphenbutazone   Bufferin plain or Extra Strength Feldene Capsules Oxycodone with Aspirin   Bufferin with Codeine Fenoprofen Fenoprofen Pabalate or Pabalate-SF   Buffets II Flogesic Panagesic   Buffinol plain or Extra Strength Florinal or Florinal with Codeine Panwarfarin   Buf-Tabs Flurbiprofen Penicillamine   Butalbital Compound Four-way cold tablets Penicillin   Butazolidin Fragmin Pepto-Bismol   Carbenicillin Geminisyn Percodan   Carna Arthritis Reliever Geopen Persantine   Carprofen Gold's salt Persistin   Chloramphenicol Goody's Phenylbutazone   Chloromycetin Haltrain Piroxlcam   Clmetidine heparin Plaquenil   Cllnoril Hyco-pap Ponstel   Clofibrate Hydroxy chloroquine Propoxyphen         Before stopping any of these medications, be sure to consult the physician who ordered them.  Some, such as Coumadin (Warfarin) are ordered to prevent or treat serious conditions such as "deep thrombosis", "pumonary embolisms", and other heart problems.  The amount of time that you may need off of the medication may also vary with the medication and the reason for which you were taking it.  If you are taking any of these medications, please make sure you notify your pain physician before you undergo any  procedures.     DO NOT EAT OR DRINK FOR 8 HOURS PRIOR TO PROCEDURE, BRING A DRIVER, TAKE BLOOD PRESSURE MED THE MORNING OF PROCEDURE.

## 2015-10-08 NOTE — Progress Notes (Signed)
Patient's Name: Molly Rogers  Patient type: Established  MRN: [id number]  Service setting: Ambulatory outpatient  DOB: 06-18-1960  Location: [facility]  DOS: 10/08/2015  Primary Care Physician: Molly Rogers., PA  Note by: Molly Rogers. Dossie Arbour, M.D, DABA, DABAPM, DABPM, DABIPP, FIPP  Referring Physician: Christie Nottingham, PA  Specialty: Board-Certified Interventional Pain Management  Last Visit to Pain Management: 09/11/2015   Primary Reason(s) for Visit: Encounter for prescription drug management (Level of risk: moderate) CC: Back Pain; Hip Pain; and Knee Pain   HPI  [name] is a 55 y.o. year old, female patient, who returns today as an established patient. She has SOB (shortness of breath); Atrial flutter ([place]); Abdominal discomfort; Essential hypertension; Anxiety; Acquired aphasia; Appendicular ataxia; CCF (congestive cardiac failure) ([place]); Chronic headache; CAFL (chronic airflow limitation) ([place]); Dermatitis, eczematoid; Benign essential tremor; BP (high blood pressure); ILD (interstitial lung disease) ([place]); Neuritis or radiculitis due to rupture of lumbar intervertebral disc; Benign neoplasm of meninges (HCC); MI (mitral incompetence); Neurosis, posttraumatic; Apnea, sleep; Transient cerebral ischemia due to atrial fibrillation ([place]); Has a tremor; Chronic trochanteric bursitis; Fibromyalgia; Atrial fibrillation ([place]); Abnormal uterine bleeding; Congestive heart failure ([place]); Chronic obstructive pulmonary disease ([place]); Interstitial lung disease ([place]); Neoplasm of meninges ([place]); Spasm; Female climacteric state; Sacrococcygeal disorders, not elsewhere classified; Female genuine stress incontinence; Breathlessness on exertion; Paroxysmal atrial fibrillation ([place]); Obstructive apnea; Combined fat and carbohydrate induced hyperlipemia; Benign essential HTN; Chronic pain; Long term current use of opiate analgesic; Lumbar spondylosis; Chronic lower extremity  pain (Location of Tertiary source of pain) (Bilateral) (L>R); Chronic low back pain (Location of Primary Source of Pain) (Bilateral) (Midline) (R>L); Chronic hip pain (Location of Secondary source of pain) (Bilateral) (R>L); Osteoarthritis of hips (Location of Secondary source of pain) (Bilateral) (R>L); Chronic knee pain (Bilateral) (L>R); Chronic neck pain (posterior and central); Osteoarthritis of knees (Bilateral) (L>R); Obesity, Class III, BMI 40-49.9 (morbid obesity) ([place]); Abnormal MRI, lumbar spine (06/13/2013); Lumbar facet arthropathy/hypertrophy; Lumbar facet syndrome (Location of Primary Source of Pain) (Bilateral) (R>L); Lumbar central spinal stenosis (L4-5); Lumbar foraminal stenosis (Bilateral) (L5-S1); Heel spurs (Bilateral); Abnormal MRI, knee (11/16/2013) (Left); Chondromalacia patellae of knee (Left); Left frontal Meningioma ([place]); Hypokalemia; Opiate use (60 MME/Day); Long term prescription opiate use; Encounter for therapeutic drug level monitoring; Personal history of other specified conditions; Primary osteoarthritis of left knee; Trochanteric bursitis of hip (Right); Neurogenic pain; Musculoskeletal pain; and Lumbar Grade 1 Anterolisthesis of L4 over L5 on her problem list.. Her primarily concern today is the Back Pain; Hip Pain; and Knee Pain   Pain Assessment: Self-Reported Pain Score: 3              Reported level is compatible with observation       Pain Type: Chronic pain Pain Location: Back (hips) Pain Orientation: Lower Pain Descriptors / Indicators: Aching, Throbbing, Sharp Pain Frequency: Intermittent  The patient comes into the clinics today for pharmacological management of her chronic pain. I last saw this patient on 09/02/2015. The patient  reports that she does not use illicit drugs. Her body mass index is 37.74 kg/(m^2).  Pending facet block #2 next Tuesday.  Date of Last Visit: 09/02/15 Service Provided on Last Visit: Med Refill  Controlled Substance  Pharmacotherapy Assessment & REMS (Risk Evaluation and Mitigation Strategy)  Analgesic: Oxycodone IR 10 mg every 6 hours (40 mg/day of oxycodone) MME/day: 60 mg/day. Pill Count: Oxycodone pill count #9/120 Filled 09-10-15 Pharmacokinetics: Onset of action (Liberation/Absorption): Within expected pharmacological parameters  Time to Peak effect (Distribution): Timing and results are as within normal expected parameters Duration of action (Metabolism/Excretion): Within normal limits for medication Pharmacodynamics: Analgesic Effect: More than 50% Activity Facilitation: Medication(s) allow patient to sit, stand, walk, and do the basic ADLs Perceived Effectiveness: Described as relatively effective, allowing for increase in activities of daily living (ADL) Side-effects or Adverse reactions: None reported Monitoring: [place] PMP: Online review of the past 63-month period conducted. Compliant with practice rules and regulations Last UDS on record: TOXASSURE SELECT 13  Date Value Ref Range Status  09/02/2015 FINAL  Final    Comment:    ==================================================================== TOXASSURE SELECT 13 (MW) ==================================================================== Test                             Result       Flag       Units Drug Present and Declared for Prescription Verification   Alprazolam                     314          EXPECTED   ng/mg creat   Alpha-hydroxyalprazolam        516          EXPECTED   ng/mg creat    Source of alprazolam is a scheduled prescription medication.    Alpha-hydroxyalprazolam is an expected metabolite of alprazolam.   Oxycodone                      2226         EXPECTED   ng/mg creat   Oxymorphone                    379          EXPECTED   ng/mg creat   Noroxycodone                   3229         EXPECTED   ng/mg creat    Sources of oxycodone include scheduled prescription medications.    Oxymorphone and noroxycodone are expected  metabolites of    oxycodone. Oxymorphone is also available as a scheduled    prescription medication. ==================================================================== Test                      Result    Flag   Units      Ref Range   Creatinine              102              mg/dL      >=20 ==================================================================== Declared Medications:  The flagging and interpretation on this report are based on the  following declared medications.  Unexpected results may arise from  inaccuracies in the declared medications.  **Note: The testing scope of this panel includes these medications:  Alprazolam  Oxycodone  **Note: The testing scope of this panel does not include following  reported medications:  Albuterol  Bupropion  Cetirizine  Fluticasone  Losartan  Lubiprostone  Naloxone  Pravastatin  Salmeterol  Solifenacin ==================================================================== For clinical consultation, please call [phone]. ====================================================================    UDS interpretation: Compliant          Medication Assessment Form: Reviewed. Patient indicates being compliant with therapy Treatment compliance: Compliant Risk Assessment: Aberrant Behavior: None observed today Substance Use Disorder (SUD) Risk Level:  Moderate-to-high Risk of opioid abuse or dependence: 0.7-3.0% with doses ? 36 MME/day and 6.1-26% with doses ? 120 MME/day. Opioid Risk Tool (ORT) Score:  4 Moderate Risk for SUD (Score between 4-7) Depression Scale Score: PHQ-2: PHQ-2 Total Score: 0 No depression (0) PHQ-9: PHQ-9 Total Score: 0 No depression (0-4)  Pharmacologic Plan: No change in therapy, at this time  Laboratory Chemistry  Inflammation Markers Lab Results  Component Value Date   ESRSEDRATE 14 08/08/2015   CRP <0.5 08/08/2015    Renal Function Lab Results  Component Value Date   BUN 13 08/08/2015    CREATININE 0.86 08/08/2015   GFRAA >60 08/08/2015   GFRNONAA >60 08/08/2015    Hepatic Function Lab Results  Component Value Date   AST 20 08/08/2015   ALT 17 08/08/2015   ALBUMIN 3.9 08/08/2015    Electrolytes Lab Results  Component Value Date   NA 139 08/08/2015   K 3.7 08/08/2015   CL 103 08/08/2015   CALCIUM 9.1 08/08/2015   MG 2.1 08/08/2015    Pain Modulating Vitamins Lab Results  Component Value Date   25OHVITD1 34 08/08/2015   25OHVITD2 1.0 08/08/2015   25OHVITD3 33 08/08/2015   VITAMINB12 385 08/08/2015    Coagulation Parameters Lab Results  Component Value Date   PLT 291 06/09/2015    Cardiovascular Lab Results  Component Value Date   BNP 8401* 04/24/2012   HGB 15.9 06/09/2015   HCT 46.7 06/09/2015    Note: Lab results reviewed.  Recent Diagnostic Imaging  Cervical Imaging: Cervical MR wo contrast:  Results for orders placed in visit on 06/13/13  MR C Spine Ltd W/O Cm   Narrative * PRIOR REPORT IMPORTED FROM AN EXTERNAL SYSTEM *   CLINICAL DATA:  Left-sided neck pain.   EXAM:  MRI CERVICAL SPINE WITHOUT CONTRAST   TECHNIQUE:  Multiplanar, multisequence MR imaging was performed. No intravenous  contrast was administered.   COMPARISON:  None.   FINDINGS:  The visualized intracranial contents and cervical spinal cord are  normal. Paraspinal soft tissues are normal except for multiple  cystic lesions in both lobes of the thyroid gland. The finding is  consistent with a multinodular goiter. There is a dominant  inhomogeneous 2.6 cm nodule in the right lower pole. If this has not  been previously assessed, I recommend evaluation with thyroid  ultrasound.   C1-2 and C2-3: Tiny disc bulge in uncinate spurs extend into the  proximal left neural foramen with out foraminal stenosis or focal  neural impingement.   C3-4:  Normal.   C4-5: Small uncinate spurs and disc bulge into the left neural  foramen without foraminal stenosis.    C5-6:  Very tiny central disc bulge with no neural impingement.   C6-7:  Tiny central disc bulge with no neural impingement.   C7-T1 and T1-2:  Normal.   There is no facet arthritis in the cervical spine.   IMPRESSION:  1. Diffuse slight degenerative disc disease without neural  impingement.  2. Complex 2.6 cm lesion in the lower pole the right lobe of the  thyroid gland. Ultrasound recommended for further evaluation if not  previously assessed.    Electronically Signed    By: Rozetta Nunnery M.D.    On: 06/13/2013 15:25       Lumbosacral Imaging: Lumbar MR wo contrast:  Results for orders placed during the hospital encounter of 07/03/15  MR Lumbar Spine Wo Contrast   Narrative CLINICAL DATA:  Low back pain  with LEFT leg pain extending to the foot. Numbness in the upper leg anteriorly.  EXAM: MRI LUMBAR SPINE WITHOUT CONTRAST  TECHNIQUE: Multiplanar, multisequence MR imaging of the lumbar spine was performed. No intravenous contrast was administered.  COMPARISON:  MRI lumbar spine 06/13/2013.  FINDINGS: Segmentation: Normal.  Alignment:  Normal.  Vertebrae: No worrisome osseous lesion.  Conus medullaris: Normal in size, signal, and location.  Paraspinal tissues: No evidence for hydronephrosis or paravertebral mass.  Disc levels:  L1-L2:  Normal disc space.  L2-L3:  Normal disc space.  Mild facet arthropathy.  No impingement.  L3-L4: Normal disc space. Mild to moderate facet arthropathy and ligamentum flavum hypertrophy. No subarticular zone or foraminal zone narrowing.  L4-L5: Trace anterolisthesis. Moderate facet arthropathy and ligamentum flavum hypertrophy. Annular bulging. Moderate to severe stenosis. LEFT greater than RIGHT L5 nerve root impingement. Mild BILATERAL foraminal narrowing does not clearly affect the L4 nerve roots.  L5-S1: Mild disc space narrowing. Central protrusion. Posterior element hypertrophy. No definite subarticular zone  narrowing. Mild foraminal narrowing could affect either L5 nerve root.  Compared with priors, there is slight worsening since 2015.  IMPRESSION: The dominant abnormality is at L4-5, where moderate to severe stenosis of a multifactorial nature relates to annular bulging, trace facet mediated slip, and moderate posterior element hypertrophy, affecting the LEFT greater than RIGHT L5 nerve roots. See comments above.  BILATERAL foraminal narrowing at L5-S1 could contribute to the LEFT leg symptoms. Correlate clinically.   Electronically Signed   By: Staci Righter M.D.   On: 07/03/2015 13:32    Lumbar MR wo contrast:  Results for orders placed in visit on 06/13/13  MR L Spine Ltd W/O Cm   Narrative * PRIOR REPORT IMPORTED FROM AN EXTERNAL SYSTEM *   CLINICAL DATA:  Low back pain   EXAM:  MRI LUMBAR SPINE WITHOUT CONTRAST   TECHNIQUE:  Multiplanar, multisequence MR imaging was performed. No intravenous  contrast was administered.   COMPARISON:  None.   FINDINGS:  The vertebral bodies of the lumbar spine are normal in size and  alignment. There is normal bone marrow signal demonstrated  throughout the vertebra. The intervertebral disc spaces are  well-maintained.   The spinal cord is normal in signal and contour. The cord terminates  normally at L1 . The nerve roots of the cauda equina and the filum  terminale are normal.   The visualized portions of the SI joints are unremarkable.   The imaged intra-abdominal contents are unremarkable.   T12-L1: No significant disc bulge. No evidence of neural foraminal  stenosis. No central canal stenosis.   L1-L2: No significant disc bulge. No evidence of neural foraminal  stenosis. No central canal stenosis.   L2-L3: No significant disc bulge. No evidence of neural foraminal  stenosis. No central canal stenosis.   L3-L4: No significant disc bulge. No evidence of neural foraminal  stenosis. No central canal stenosis.   L4-L5:  Mild broad-based disc bulge. Moderate bilateral facet  arthropathy with ligamentum flavum infolding resulting in mild  spinal stenosis. Mild bilateral foraminal stenosis.   L5-S1: Mild broad-based disc bulge. Mild right facet arthropathy.  Moderate bilateral foraminal stenosis. No central canal stenosis.   IMPRESSION:  1. At L4-5 there is a mild broad-based disc bulge with moderate  bilateral facet arthropathy and ligamentum flavum infolding  resulting in mild spinal stenosis.  2. At L5-S1 there is a mild broad-based disc bulge. Moderate  bilateral foraminal stenosis.    Electronically Signed  By: Kathreen Devoid    On: 06/13/2013 15:33       Knee Imaging: Knee-L MR w contrast:  Results for orders placed in visit on 11/16/13  MR Knee Left  Wo Contrast   Narrative * PRIOR REPORT IMPORTED FROM AN EXTERNAL SYSTEM *   CLINICAL DATA:  Diffuse progressive left knee pain with swelling.   EXAM:  MRI OF THE LEFT KNEE WITHOUT CONTRAST   TECHNIQUE:  Multiplanar, multisequence MR imaging of the knee was performed. No  intravenous contrast was administered.   COMPARISON:  MRI dated 11/09/2012   FINDINGS:  MENISCI   Medial meniscus:  Intact.   Lateral meniscus:  Intact.   LIGAMENTS   Cruciates:  Normal.   Collaterals:  Normal.   CARTILAGE   Patellofemoral: Almost complete denuding of the cartilage of the  patella with focal grade 4 chondromalacia of the lateral aspect of  the trochlear groove. This has progressed markedly since the prior  study of 11/09/2012. There is also what appears to be a large  cartilage fragment in the suprapatellar recess measuring 18 x 14 x  2.5 mm.   Medial: Focal 10 mm diameter area of full-thickness cartilage loss  of central portion of the medial femoral condyle.   Lateral:  Normal.   Joint:  Moderate joint effusion.   Popliteal Fossa:  Tiny Baker's cyst.   Extensor Mechanism:  Normal.   Bones: Small marginal osteophytes in the  patellofemoral and medial  compartments.   IMPRESSION:  1. Marked progression of extensive chondromalacia in the  patellofemoral compartment.  2. Possible large cartilaginous fragment in the suprapatellar  recess.  3. Full-thickness cartilage loss in the central portion of the  medial femoral condyle. Moderate joint effusion.    Electronically Signed    By: Rozetta Nunnery M.D.    On: 11/16/2013 16:22       Knee-R DG 4 views:  Results for orders placed in visit on 08/08/07  DG Knee Complete 4 Views Right   Narrative * PRIOR REPORT IMPORTED FROM AN EXTERNAL SYSTEM *   PRIOR REPORT IMPORTED FROM THE [facility] WORKFLOW SYSTEM   REASON FOR EXAM:    Degenerative joint disease  COMMENTS:   PROCEDURE:     DXR - DXR KNEE RT COMP WITH OBLIQUES  - Aug 08 2007  8:57AM   RESULT:     Images of the RIGHT knee demonstrate no definite fracture,  dislocation or radiopaque foreign body. Degenerative joint space narrowing  is present especially medially and in the patellofemoral region.   IMPRESSION:      Degenerative changes. No acute bony abnormality evident.   Thank you for this opportunity to contribute to the care of your patient.       Note: Imaging results reviewed.  Meds  The patient has a current medication list which includes the following prescription(s): albuterol, alprazolam, bupropion, cyclobenzaprine, gabapentin, losartan-hydrochlorothiazide, lubiprostone, naloxone hcl, oxycodone hcl, pravastatin, solifenacin, and vortioxetine hbr.  Current Outpatient Prescriptions on File Prior to Visit  Medication Sig  . albuterol (PROVENTIL) (2.5 MG/3ML) 0.083% nebulizer solution Take 2.5 mg by nebulization every 6 (six) hours as needed for wheezing.  Marland Kitchen ALPRAZolam (XANAX) 1 MG tablet Take 1 mg by mouth 3 (three) times daily.  Marland Kitchen buPROPion (WELLBUTRIN XL) 150 MG 24 hr tablet Take 300 mg by mouth daily.   Marland Kitchen losartan-hydrochlorothiazide (HYZAAR) 100-25 MG tablet Take 1 tablet by mouth.  .  lubiprostone (AMITIZA) 8 MCG capsule Take 1 tablet by mouth  2 (two) times daily.   . Naloxone HCl (NARCAN) 4 MG/0.1ML LIQD Spray into one nostril. Repeat with second device into other nostril after 2-3 minutes if no or minimal response.  . pravastatin (PRAVACHOL) 40 MG tablet Take 40 mg by mouth daily.  . solifenacin (VESICARE) 10 MG tablet Take 1 tablet by mouth daily. Reported on 07/11/2015   No current facility-administered medications on file prior to visit.    ROS  Constitutional: Denies any fever or chills Gastrointestinal: No reported hemesis, hematochezia, vomiting, or acute GI distress Musculoskeletal: Denies any acute onset joint swelling, redness, loss of ROM, or weakness Neurological: No reported episodes of acute onset apraxia, aphasia, dysarthria, agnosia, amnesia, paralysis, loss of coordination, or loss of consciousness  Allergies  Ms. Motherway is allergic to amitriptyline; morphine and related; naproxen; and prednisone.  [place]  Medical:  Ms. Gudger  has a past medical history of Hypertension; A-fib ([place]); Hyperlipidemia; Asthma; Ruptured appendix; COPD (chronic obstructive pulmonary disease) ([place]); Parkinson's disease (tremor, stiffness, slow motion, unstable posture) (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 Family: family history includes Heart disease in her father and mother; Heart failure in her father; Hyperlipidemia in her father; Hypertension in her father. Surgical:  has past surgical history that includes  Appendectomy; Carpal tunnel release; and Finger surgery. Tobacco:  reports that she has been smoking Cigarettes.  She has a 17.5 pack-year smoking history. She has never used smokeless tobacco. Alcohol:  reports that she drinks about 0.6 oz of alcohol per week. Drug:  reports that she does not use illicit drugs.  Constitutional Exam  Vitals: Blood pressure 115/71, pulse 85, temperature 98.2 F (36.8 C), temperature source Oral, resp. rate 16, height 5\' 4"  (1.626 m), weight 220 lb (99.791 kg), SpO2 99 %. General appearance: Well nourished, well developed, and well hydrated. In no acute distress Calculated BMI/Body habitus: Body mass index is 37.74 kg/(m^2).       Psych/Mental status: Alert and oriented x 3 (person, place, & time) Eyes: PERLA Respiratory: No evidence of acute respiratory distress  Cervical Spine Exam  Inspection: No masses, redness, or swelling Alignment: Symmetrical ROM: Functional: ROM is within functional limits [facility]) Stability: No instability detected Muscle strength & Tone: Functionally intact Sensory: Unimpaired Palpation: No complaints of tenderness  Upper Extremity (UE) Exam    Side: Right upper extremity  Side: Left upper extremity  Inspection: No masses, redness, swelling, or asymmetry  Inspection: No masses, redness, swelling, or asymmetry  ROM:  ROM:  Functional: ROM is within functional limits [facility])        Functional: ROM is within functional limits [facility])        Muscle strength & Tone: Functionally intact  Muscle strength & Tone: Functionally intact  Sensory: Unimpaired  Sensory: Unimpaired  Palpation: No complaints of tenderness  Palpation: No complaints of tenderness   Thoracic Spine Exam  Inspection: No masses, redness, or swelling Alignment: Symmetrical ROM: Functional: ROM is within functional limits [facility]) Stability: No instability detected Sensory: Unimpaired Muscle strength & Tone: Functionally intact Palpation: No complaints of  tenderness  Lumbar Spine Exam  Inspection: No masses, redness, or swelling Alignment: Symmetrical ROM: Functional: ROM is  within functional limits [facility]) Stability: No instability detected Muscle strength & Tone: Functionally intact Sensory: Unimpaired Palpation: No complaints of tenderness Provocative Tests: Lumbar Hyperextension and rotation test: evaluation deferred today       Patrick's Maneuver: evaluation deferred today              Gait & Posture Assessment  Ambulation: Unassisted Gait: Unaffected Posture: WNL   Lower Extremity Exam    Side: Right lower extremity  Side: Left lower extremity  Inspection: No masses, redness, swelling, or asymmetry ROM:  Inspection: No masses, redness, swelling, or asymmetry ROM:  Functional: ROM is within functional limits [facility])        Functional: ROM is within functional limits [facility])        Muscle strength & Tone: Functionally intact  Muscle strength & Tone: Functionally intact  Sensory: Unimpaired  Sensory: Unimpaired  Palpation: No complaints of tenderness  Palpation: No complaints of tenderness   Assessment & Plan  Primary Diagnosis & Pertinent Problem List: The primary encounter diagnosis was Chronic pain. Diagnoses of Long term current use of opiate analgesic, Encounter for therapeutic drug level monitoring, Opiate use (60 MME/Day), Chronic low back pain (Location of Primary Source of Pain) (Bilateral) (Midline) (R>L), Chronic hip pain, unspecified laterality, Chronic pain of lower extremity, unspecified laterality, Lumbar facet syndrome (Location of Primary Source of Pain) (Bilateral) (R>L), Primary osteoarthritis of both hips, Neurogenic pain, Musculoskeletal pain, Lumbar Grade 1 Anterolisthesis of L4 over L5, and Lumbar central spinal stenosis (L4-5) were also pertinent to this visit.  Visit Diagnosis: 1. Chronic pain   2. Long term current use of opiate analgesic   3. Encounter for therapeutic drug level monitoring   4. Opiate use  (60 MME/Day)   5. Chronic low back pain (Location of Primary Source of Pain) (Bilateral) (Midline) (R>L)   6. Chronic hip pain, unspecified laterality   7. Chronic pain of lower extremity, unspecified laterality   8. Lumbar facet syndrome (Location of Primary Source of Pain) (Bilateral) (R>L)   9. Primary osteoarthritis of both hips   10. Neurogenic pain   11. Musculoskeletal pain   12. Lumbar Grade 1 Anterolisthesis of L4 over L5   13. Lumbar central spinal stenosis (L4-5)     Problems updated and reviewed during this visit: Problem  Neurogenic Pain  Musculoskeletal Pain  Lumbar Grade 1 Anterolisthesis of L4 over L5  Lumbar facet arthropathy/hypertrophy  Lumbar central spinal stenosis (L4-5)   Moderate to severe. Left greater than right L5 nerve root impingement. Mild bilateral foraminal narrowing.   Trochanteric bursitis of hip (Right)  Neurosis, Posttraumatic  Personal History of Other Specified Conditions  Breathlessness On Exertion   Overview:  A. 04/2015: ETT: Normal   Paroxysmal Atrial Fibrillation (Hcc)   Overview:  A. 04/2015: ECHO: EF 55%, trivial valvular disease   Combined Fat and Carbohydrate Induced Hyperlipemia   Overview:  A. RX Pravastatin 20mg m B.    Chronic Obstructive Pulmonary Disease (Hcc)  Interstitial Lung Disease (Hcc)  Dermatitis, Eczematoid  Primary Osteoarthritis of Left Knee    Problem-specific Plan(s): No problem-specific assessment & plan notes found for this encounter.  No new assessment & plan notes have been filed under this hospital service since the last note was generated. Service: Pain Management   Plan of Care   Problem List Items Addressed This Visit      High   Chronic hip pain (Location of Secondary source of pain) (Bilateral) (R>L) (Chronic)   Relevant Medications   Oxycodone  HCl 10 MG TABS   gabapentin (NEURONTIN) 300 MG capsule   cyclobenzaprine (FLEXERIL) 10 MG tablet   vortioxetine HBr (TRINTELLIX) 10 MG TABS    Chronic low back pain (Location of Primary Source of Pain) (Bilateral) (Midline) (R>L) (Chronic)   Relevant Medications   Oxycodone HCl 10 MG TABS   cyclobenzaprine (FLEXERIL) 10 MG tablet   Chronic lower extremity pain (Location of Tertiary source of pain) (Bilateral) (L>R) (Chronic)   Chronic pain - Primary (Chronic)   Relevant Medications   Oxycodone HCl 10 MG TABS   gabapentin (NEURONTIN) 300 MG capsule   cyclobenzaprine (FLEXERIL) 10 MG tablet   vortioxetine HBr (TRINTELLIX) 10 MG TABS   Lumbar central spinal stenosis (L4-5) (Chronic)   Lumbar facet syndrome (Location of Primary Source of Pain) (Bilateral) (R>L) (Chronic)   Relevant Medications   Oxycodone HCl 10 MG TABS   cyclobenzaprine (FLEXERIL) 10 MG tablet   Lumbar Grade 1 Anterolisthesis of L4 over L5 (Chronic)   Musculoskeletal pain (Chronic)   Relevant Medications   cyclobenzaprine (FLEXERIL) 10 MG tablet   Neurogenic pain (Chronic)   Relevant Medications   gabapentin (NEURONTIN) 300 MG capsule   Osteoarthritis of hips (Location of Secondary source of pain) (Bilateral) (R>L) (Chronic)   Relevant Medications   Oxycodone HCl 10 MG TABS   cyclobenzaprine (FLEXERIL) 10 MG tablet     Medium   Encounter for therapeutic drug level monitoring   Long term current use of opiate analgesic (Chronic)   Opiate use (60 MME/Day) (Chronic)       Pharmacotherapy (Medications Ordered): Meds ordered this encounter  Medications  . Oxycodone HCl 10 MG TABS    Sig: Take 1 tablet (10 mg total) by mouth every 6 (six) hours as needed.    Dispense:  120 tablet    Refill:  0    Do not place this medication, or any other prescription from our practice, on "Automatic Refill". Patient may have prescription filled one day early if pharmacy is closed on scheduled refill date. Do not fill until: 10/10/15 To last until: 11/09/15  . gabapentin (NEURONTIN) 300 MG capsule    Sig: Take 1 capsule (300 mg total) by mouth 3 (three) times daily.     Dispense:  90 capsule    Refill:  0    Do not add this medication to the electronic "Automatic Refill" notification system. Patient may have prescription filled one day early if pharmacy is closed on scheduled refill date.  . cyclobenzaprine (FLEXERIL) 10 MG tablet    Sig: Take 1 tablet (10 mg total) by mouth 2 (two) times daily. Muscle relaxant.    Dispense:  60 tablet    Refill:  0    Do not add this medication to the electronic "Automatic Refill" notification system. Patient may have prescription filled one day early if pharmacy is closed on scheduled refill date.    Lab-work & Procedure Ordered: No orders of the defined types were placed in this encounter.    Imaging Ordered: None  Interventional Therapies: Scheduled: Diagnostic bilateral lumbar facet block under fluoroscopic guidance and IV sedation #2.    Considering: Possible bilateral lumbar facet radiofrequency ablation.    PRN Procedures: None at this point.        Referral(s) or Consult(s): None at this time.  New Prescriptions   No medications on file    Medications administered during this visit: Ms. Henrich had no medications administered during this visit.  Requested PM Follow-up: Return in about  3 weeks (around 10/27/2015) for (1-Mo), Med-Mgmt, Keep prior appointment.  Future Appointments Date Time Provider [place]  10/14/2015 1:15 PM Milinda Pointer, MD [facility] None  10/20/2015 3:20 PM [facility]  10/27/2015 9:20 AM Milinda Pointer, MD [facility] None    Primary Care Physician: Molly Rogers., PA Location: [facility] Out[facility] Facility Note by: Molly Rogers. Dossie Arbour, M.D, DABA, DABAPM, DABPM, DABIPP, FIPP  Pain Score Disclaimer: We use the NRS-11 scale. This is a self-reported, subjective measurement of pain severity with only modest accuracy. It is used primarily to identify changes within a particular patient. It must be understood that outpatient pain  scales are significantly less accurate that those used for research, where they can be applied under ideal controlled circumstances with minimal exposure to variables. In reality, the score is likely to be a combination of pain intensity and pain affect, where pain affect describes the degree of emotional arousal or changes in action readiness caused by the sensory experience of pain. Factors such as social and work situation, setting, emotional state, anxiety levels, expectation, and prior pain experience may influence pain perception and show large inter-individual differences that may also be affected by time variables.  Patient instructions provided during this appointment: Patient Instructions   GENERAL RISKS AND COMPLICATIONS  What are the risk, side effects and possible complications? Generally speaking, most procedures are safe.  However, with any procedure there are risks, side effects, and the possibility of complications.  The risks and complications are dependent upon the sites that are lesioned, or the type of nerve block to be performed.  The closer the procedure is to the spine, the more serious the risks are.  Great care is taken when placing the radio frequency needles, block needles or lesioning probes, but sometimes complications can occur. 1. Infection: Any time there is an injection through the skin, there is a risk of infection.  This is why sterile conditions are used for these blocks.  There are four possible types of infection. 1. Localized skin infection. 2. Central Nervous System Infection-This can be in the form of Meningitis, which can be deadly. 3. Epidural Infections-This can be in the form of an epidural abscess, which can cause pressure inside of the spine, causing compression of the spinal cord with subsequent paralysis. This would require an emergency surgery to decompress, and there are no guarantees that the patient would recover from the paralysis. 4. Discitis-This  is an infection of the intervertebral discs.  It occurs in about 1% of discography procedures.  It is difficult to treat and it may lead to surgery.        2. Pain: the needles have to go through skin and soft tissues, will cause soreness.       3. Damage to internal structures:  The nerves to be lesioned may be near blood vessels or    other nerves which can be potentially damaged.       4. Bleeding: Bleeding is more common if the patient is taking blood thinners such as  aspirin, Coumadin, Ticiid, Plavix, etc., or if he/she have some genetic predisposition  such as hemophilia. Bleeding into the spinal canal can cause compression of the spinal  cord with subsequent paralysis.  This would require an emergency surgery to  decompress and there are no guarantees that the patient would recover from the  paralysis.       5. Pneumothorax:  Puncturing of a lung is a possibility, every time a  needle is introduced in  the area of the chest or upper back.  Pneumothorax refers to free air around the  collapsed lung(s), inside of the thoracic cavity (chest cavity).  Another two possible  complications related to a similar event would include: Hemothorax and Chylothorax.   These are variations of the Pneumothorax, where instead of air around the collapsed  lung(s), you may have blood or chyle, respectively.       6. Spinal headaches: They may occur with any procedures in the area of the spine.       7. Persistent CSF (Cerebro-Spinal Fluid) leakage: This is a rare problem, but may occur  with prolonged intrathecal or epidural catheters either due to the formation of a fistulous  track or a dural tear.       8. Nerve damage: By working so close to the spinal cord, there is always a possibility of  nerve damage, which could be as serious as a permanent spinal cord injury with  paralysis.       9. Death:  Although rare, severe deadly allergic reactions known as "Anaphylactic  reaction" can occur to any of the medications  used.      10. Worsening of the symptoms:  We can always make thing worse.  What are the chances of something like this happening? Chances of any of this occuring are extremely low.  By statistics, you have more of a chance of getting killed in a motor vehicle accident: while driving to the hospital than any of the above occurring .  Nevertheless, you should be aware that they are possibilities.  In general, it is similar to taking a shower.  Everybody knows that you can slip, hit your head and get killed.  Does that mean that you should not shower again?  Nevertheless always keep in mind that statistics do not mean anything if you happen to be on the wrong side of them.  Even if a procedure has a 1 (one) in a 1,000,000 (million) chance of going wrong, it you happen to be that one..Also, keep in mind that by statistics, you have more of a chance of having something go wrong when taking medications.  Who should not have this procedure? If you are on a blood thinning medication (e.g. Coumadin, Plavix, see list of "Blood Thinners"), or if you have an active infection going on, you should not have the procedure.  If you are taking any blood thinners, please inform your physician.  How should I prepare for this procedure?  Do not eat or drink anything at least six hours prior to the procedure.  Bring a driver with you .  It cannot be a taxi.  Come accompanied by an adult that can drive you back, and that is strong enough to help you if your legs get weak or numb from the local anesthetic.  Take all of your medicines the morning of the procedure with just enough water to swallow them.  If you have diabetes, make sure that you are scheduled to have your procedure done first thing in the morning, whenever possible.  If you have diabetes, take only half of your insulin dose and notify our nurse that you have done so as soon as you arrive at the clinic.  If you are diabetic, but only take blood sugar  pills (oral hypoglycemic), then do not take them on the morning of your procedure.  You may take them after you have had the procedure.  Do not take aspirin or any aspirin-containing medications, at least eleven (11) days prior to the procedure.  They may prolong bleeding.  Wear loose fitting clothing that may be easy to take off and that you would not mind if it got stained with Betadine or blood.  Do not wear any jewelry or perfume  Remove any nail coloring.  It will interfere with some of our monitoring equipment.  NOTE: Remember that this is not meant to be interpreted as a complete list of all possible complications.  Unforeseen problems may occur.  BLOOD THINNERS The following drugs contain aspirin or other products, which can cause increased bleeding during surgery and should not be taken for 2 weeks prior to and 1 week after surgery.  If you should need take something for relief of minor pain, you may take acetaminophen which is found in Tylenol,m Datril, Anacin-3 and Panadol. It is not blood thinner. The products listed below are.  Do not take any of the products listed below in addition to any listed on your instruction sheet.  A.P.C or A.P.C with Codeine Codeine Phosphate Capsules #3 Ibuprofen Ridaura  ABC compound Congesprin Imuran rimadil  Advil Cope Indocin Robaxisal  Alka-Seltzer Effervescent Pain Reliever and Antacid Coricidin or Coricidin-D  Indomethacin Rufen  Alka-Seltzer plus Cold Medicine Cosprin Ketoprofen S-A-C Tablets  Anacin Analgesic Tablets or Capsules Coumadin Korlgesic Salflex  Anacin Extra Strength Analgesic tablets or capsules CP-2 Tablets Lanoril Salicylate  Anaprox Cuprimine Capsules Levenox Salocol  Anexsia-D Dalteparin Magan Salsalate  Anodynos Darvon compound Magnesium Salicylate Sine-off  Ansaid Dasin Capsules Magsal Sodium Salicylate  Anturane Depen Capsules Marnal Soma  APF Arthritis pain formula Dewitt's Pills Measurin Stanback  Argesic Dia-Gesic  Meclofenamic Sulfinpyrazone  Arthritis Bayer Timed Release Aspirin Diclofenac Meclomen Sulindac  Arthritis pain formula Anacin Dicumarol Medipren Supac  Analgesic (Safety coated) Arthralgen Diffunasal Mefanamic Suprofen  Arthritis Strength Bufferin Dihydrocodeine Mepro Compound Suprol  Arthropan liquid Dopirydamole Methcarbomol with Aspirin Synalgos  ASA tablets/Enseals Disalcid Micrainin Tagament  Ascriptin Doan's Midol Talwin  Ascriptin A/D Dolene Mobidin Tanderil  Ascriptin Extra Strength Dolobid Moblgesic Ticlid  Ascriptin with Codeine Doloprin or Doloprin with Codeine Momentum Tolectin  Asperbuf Duoprin Mono-gesic Trendar  Aspergum Duradyne Motrin or Motrin IB Triminicin  Aspirin plain, buffered or enteric coated Durasal Myochrisine Trigesic  Aspirin Suppositories Easprin Nalfon Trillsate  Aspirin with Codeine Ecotrin Regular or Extra Strength Naprosyn Uracel  Atromid-S Efficin Naproxen Ursinus  Auranofin Capsules Elmiron Neocylate Vanquish  Axotal Emagrin Norgesic Verin  Azathioprine Empirin or Empirin with Codeine Normiflo Vitamin E  Azolid Emprazil Nuprin Voltaren  Bayer Aspirin plain, buffered or children's or timed BC Tablets or powders Encaprin Orgaran Warfarin Sodium  Buff-a-Comp Enoxaparin Orudis Zorpin  Buff-a-Comp with Codeine Equegesic Os-Cal-Gesic   Buffaprin Excedrin plain, buffered or Extra Strength Oxalid   Bufferin Arthritis Strength Feldene Oxphenbutazone   Bufferin plain or Extra Strength Feldene Capsules Oxycodone with Aspirin   Bufferin with Codeine Fenoprofen Fenoprofen Pabalate or Pabalate-SF   Buffets II Flogesic Panagesic   Buffinol plain or Extra Strength Florinal or Florinal with Codeine Panwarfarin   Buf-Tabs Flurbiprofen Penicillamine   Butalbital Compound Four-way cold tablets Penicillin   Butazolidin Fragmin Pepto-Bismol   Carbenicillin Geminisyn Percodan   Carna Arthritis Reliever Geopen Persantine   Carprofen Gold's salt Persistin    Chloramphenicol Goody's Phenylbutazone   Chloromycetin Haltrain Piroxlcam   Clmetidine heparin Plaquenil   Cllnoril Hyco-pap Ponstel   Clofibrate Hydroxy chloroquine Propoxyphen         Before stopping  any of these medications, be sure to consult the physician who ordered them.  Some, such as Coumadin (Warfarin) are ordered to prevent or treat serious conditions such as "deep thrombosis", "pumonary embolisms", and other heart problems.  The amount of time that you may need off of the medication may also vary with the medication and the reason for which you were taking it.  If you are taking any of these medications, please make sure you notify your pain physician before you undergo any procedures.     DO NOT EAT OR DRINK FOR 8 HOURS PRIOR TO PROCEDURE, BRING A DRIVER, TAKE BLOOD PRESSURE MED THE MORNING OF PROCEDURE.

## 2015-10-14 ENCOUNTER — Ambulatory Visit: Payer: Medicaid Other | Admitting: Pain Medicine

## 2015-10-20 ENCOUNTER — Ambulatory Visit: Payer: Medicaid Other | Attending: Physician Assistant

## 2015-10-21 DEATH — deceased

## 2015-10-27 ENCOUNTER — Encounter: Payer: Medicaid Other | Admitting: Pain Medicine

## 2015-10-29 ENCOUNTER — Other Ambulatory Visit: Payer: Self-pay

## 2017-08-14 IMAGING — MR MR LUMBAR SPINE W/O CM
4 of 5 series · 24 of 48 positions shown · non-contrast
Comparison: MRI lumbar spine 06/13/2013.

CLINICAL DATA: Low back pain with LEFT leg pain extending to the
foot. Numbness in the upper leg anteriorly.

EXAM:
MRI LUMBAR SPINE WITHOUT CONTRAST
TECHNIQUE: Multiplanar, multisequence MR imaging of the lumbar spine was
performed. No intravenous contrast was administered.

[Series 2: T2 · sagittal · 4.0mm · 0.81mm/px · 6 of 15 slices shown (1 of 2)]
[im 1/15]
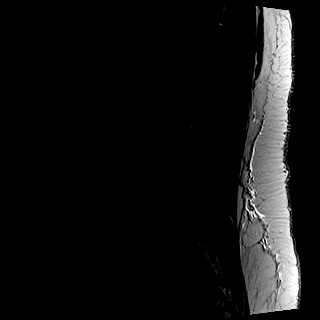
[im 3/15]
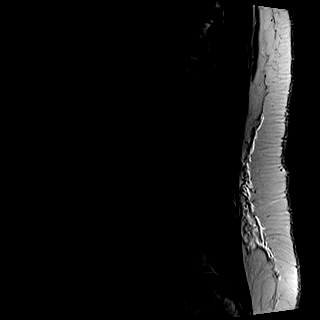
[im 6/15]
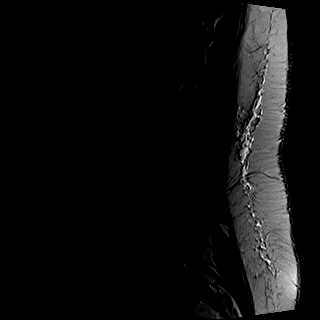
[im 9/15]
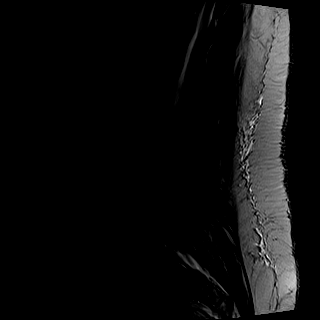
[im 12/15]
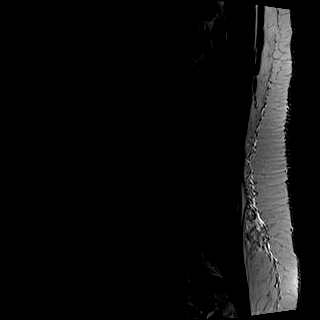
[im 15/15]
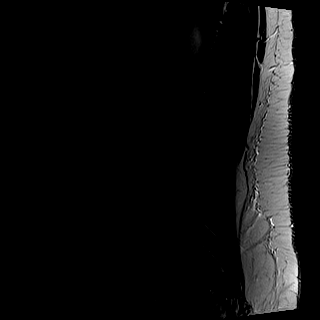

[Series 3: T1 · sagittal · 4.0mm · 0.41mm/px · 6 of 15 slices shown (1 of 2)]
[im 1/15]
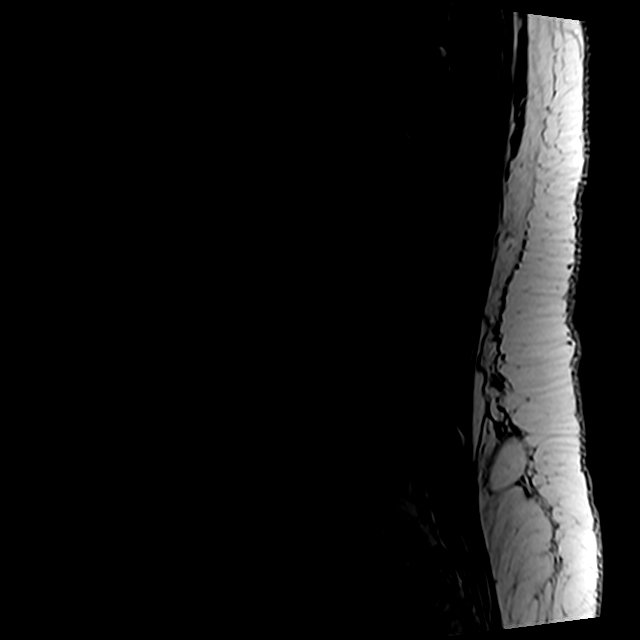
[im 3/15]
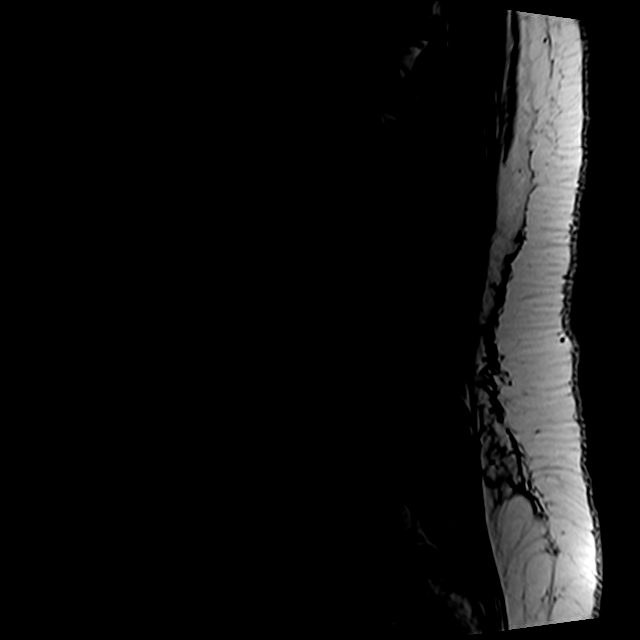
[im 6/15]
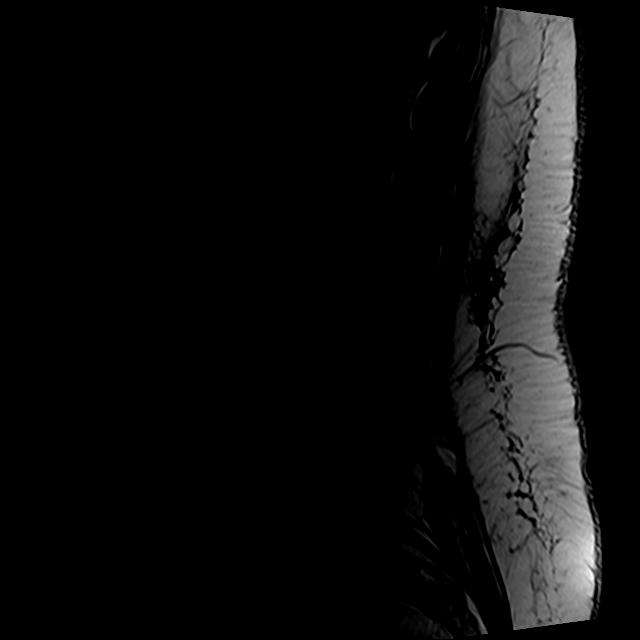
[im 9/15]
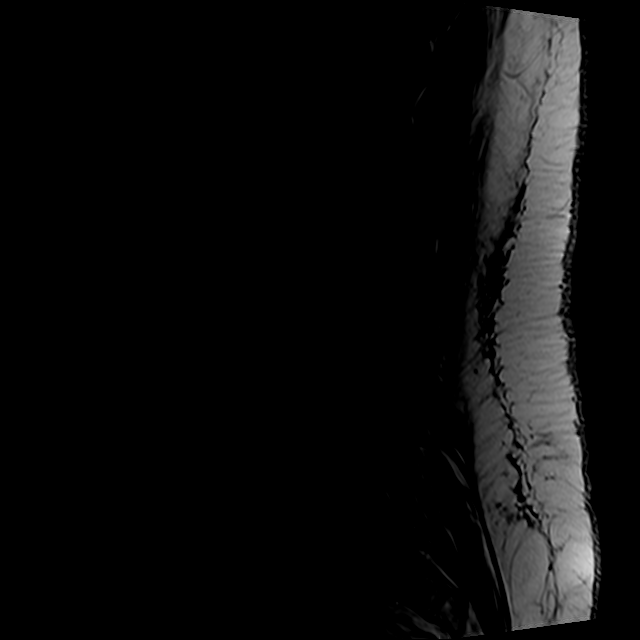
[im 12/15]
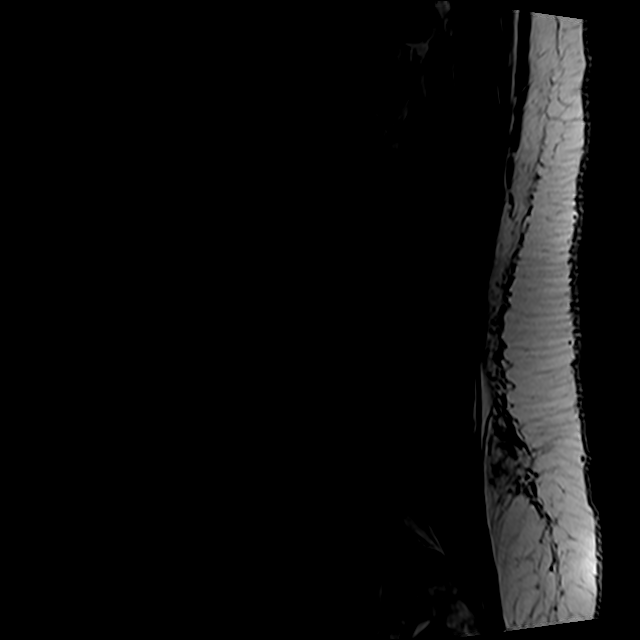
[im 15/15]
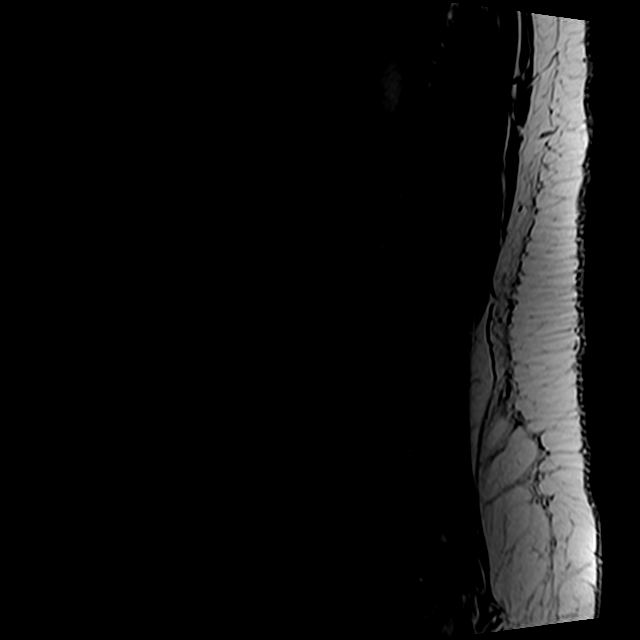

[Series 5: T2 · axial · 4.0mm · 0.78mm/px · z∈[-81,+125]mm · 9 of 35 slices shown (2 of 2)]
[im 1/35]
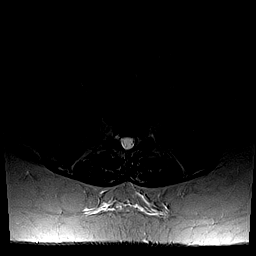
[im 5/35]
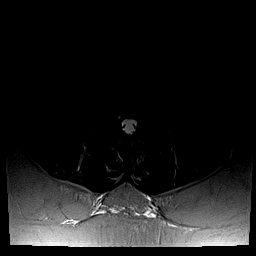
[im 10/35]
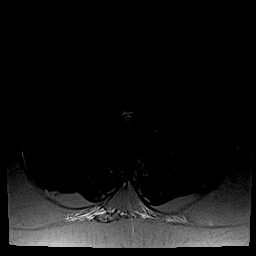
[im 15/35]
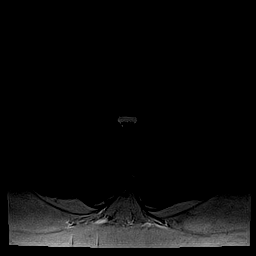
[im 18/35]
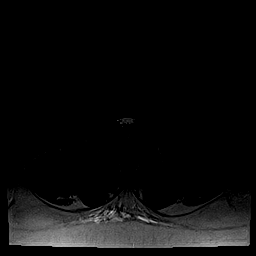
[im 20/35]
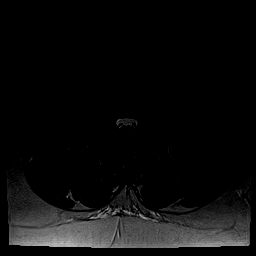
[im 25/35]
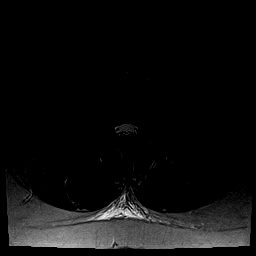
[im 30/35]
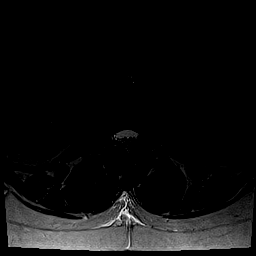
[im 35/35]
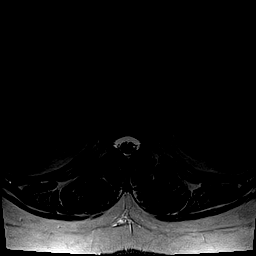

[Series 6: T1 · axial · 4.0mm · 0.31mm/px · z∈[-62,+100]mm · 3 of 35 slices shown (2 of 2)]
[im 5/35]
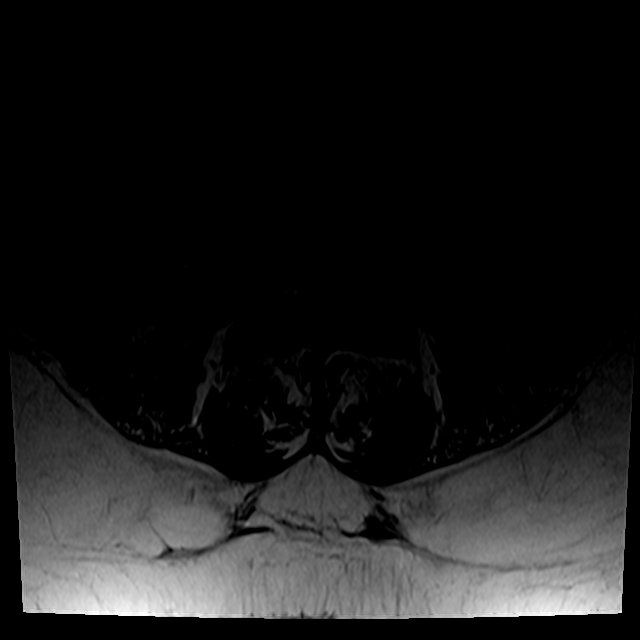
[im 18/35]
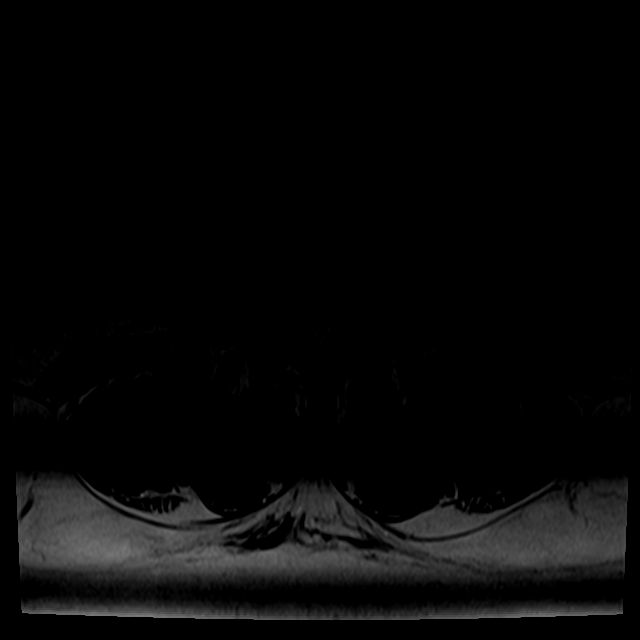
[im 30/35]
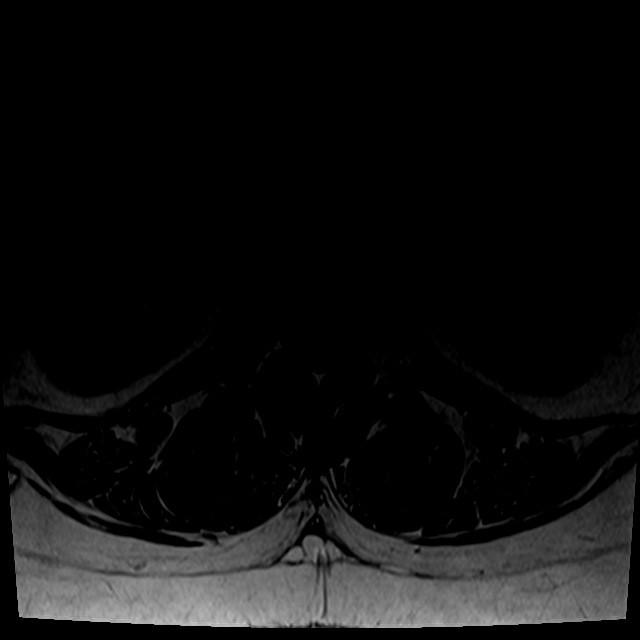

[24 of 48 positions shown; findings below may reference images not displayed]

FINDINGS: Segmentation: Normal.

Alignment:  Normal.

Vertebrae: No worrisome osseous lesion.

Conus medullaris: Normal in size, signal, and location.

Paraspinal tissues: No evidence for hydronephrosis or paravertebral
mass.

Disc levels:

L1-L2:  Normal disc space.

L2-L3:  Normal disc space.  Mild facet arthropathy.  No impingement.

L3-L4: Normal disc space. Mild to moderate facet arthropathy and
ligamentum flavum hypertrophy. No subarticular zone or foraminal
zone narrowing.

L4-L5: Trace anterolisthesis. Moderate facet arthropathy and
ligamentum flavum hypertrophy. Annular bulging. Moderate to severe
stenosis. LEFT greater than RIGHT L5 nerve root impingement. Mild
BILATERAL foraminal narrowing does not clearly affect the L4 nerve
roots.

L5-S1: Mild disc space narrowing. Central protrusion. Posterior
element hypertrophy. No definite subarticular zone narrowing. Mild
foraminal narrowing could affect either L5 nerve root.

Compared with priors, there is slight worsening since 4682.
IMPRESSION: The dominant abnormality is at L4-5, where moderate to severe
stenosis of a multifactorial nature relates to annular bulging,
trace facet mediated slip, and moderate posterior element
hypertrophy, affecting the LEFT greater than RIGHT L5 nerve roots.
See comments above.

BILATERAL foraminal narrowing at L5-S1 could contribute to the LEFT
leg symptoms. Correlate clinically.
